# Patient Record
Sex: Female | Born: 1975 | Hispanic: Yes | Marital: Married | Smoking: Former smoker
Health system: Southern US, Community
[De-identification: ages and names within clinical notes are randomized; demographics above are authoritative.]

---

## 2021-02-08 ENCOUNTER — Encounter: Payer: Self-pay | Admitting: Nurse Practitioner

## 2021-02-08 ENCOUNTER — Ambulatory Visit (INDEPENDENT_AMBULATORY_CARE_PROVIDER_SITE_OTHER): Payer: BC Managed Care – PPO | Admitting: Nurse Practitioner

## 2021-02-08 ENCOUNTER — Other Ambulatory Visit: Payer: Self-pay

## 2021-02-08 VITALS — BP 106/68 | HR 68 | Temp 97.9°F | Ht 64.0 in | Wt 252.2 lb

## 2021-02-08 DIAGNOSIS — Z6841 Body Mass Index (BMI) 40.0 and over, adult: Secondary | ICD-10-CM | POA: Diagnosis not present

## 2021-02-08 DIAGNOSIS — Z7689 Persons encountering health services in other specified circumstances: Secondary | ICD-10-CM

## 2021-02-08 DIAGNOSIS — Z1231 Encounter for screening mammogram for malignant neoplasm of breast: Secondary | ICD-10-CM

## 2021-02-08 NOTE — Progress Notes (Signed)
New Patient Office Visit  Subjective:  Patient ID: Betty Sellers, female    DOB: [birth year]  Age: 45 y.o. MRN: [id number]  CC:  Chief Complaint  Patient presents with   New Patient (Initial Visit)    HPI Betty Sellers presents to establish new primary care provider. The patient reports no pertinent, personal medical history. She odes have family history of diabetes and circulatory problems. She states that it has been some time she she had routine physical exam, labs, and mammogram. She has not had a mammogram. She has no current problems or concerns.  She denies chest pain, chest pressure, or shortness of breath. She denies headaches or visual disturbances. She denies abdominal pain, nausea, vomiting, or changes in bowel or bladder habits.    History reviewed. No pertinent past medical history.  Past Surgical History:  Procedure Laterality Date   CESAREAN SECTION  2010   CESAREAN SECTION  2012    Family History  Problem Relation Age of Onset   Diabetes Other     Social History   Socioeconomic History   Marital status: Married    Spouse name: Not on file   Number of children: Not on file   Years of education: Not on file   Highest education level: Not on file  Occupational History   Not on file  Tobacco Use   Smoking status: Former    Types: Cigarettes    Quit date: 2009    Years since quitting: 13.8   Smokeless tobacco: Never  Substance and Sexual Activity   Alcohol use: Yes   Drug use: Never   Sexual activity: Yes    Partners: Male  Other Topics Concern   Not on file  Social History Narrative   Not on file   Social Determinants of Health   Financial Resource Strain: Not on file  Food Insecurity: Not on file  Transportation Needs: Not on file  Physical Activity: Not on file  Stress: Not on file  Social Connections: Not on file  Intimate Partner Violence: Not on file    ROS Review of Systems  Constitutional:  Negative for activity change,  appetite change, chills, fatigue and fever.  HENT:  Negative for congestion, postnasal drip, rhinorrhea, sinus pressure, sinus pain, sneezing and sore throat.   Eyes: Negative.   Respiratory:  Negative for cough, chest tightness, shortness of breath and wheezing.   Cardiovascular:  Negative for chest pain and palpitations.  Gastrointestinal:  Negative for abdominal pain, constipation, diarrhea, nausea and vomiting.  Endocrine: Negative for cold intolerance, heat intolerance, polydipsia and polyuria.  Genitourinary:  Negative for dyspareunia, dysuria, flank pain, frequency and urgency.  Musculoskeletal:  Negative for arthralgias, back pain and myalgias.  Skin:  Negative for rash.  Allergic/Immunologic: Negative for environmental allergies.  Neurological:  Negative for dizziness, weakness and headaches.  Hematological:  Negative for adenopathy.  Psychiatric/Behavioral:  The patient is not nervous/anxious.    Objective:   Today's Vitals   02/08/21 1130  BP: 106/68  Pulse: 68  Temp: 97.9 F (36.6 C)  SpO2: 100%  Weight: 252 lb 3.2 oz (114.4 kg)  Height: 5\' 4"  (1.626 m)   Body mass index is 43.29 kg/m.   Physical Exam Vitals and nursing note reviewed.  Constitutional:      Appearance: Normal appearance. She is well-developed.  HENT:     Head: Normocephalic.  Eyes:     Pupils: Pupils are equal, round, and reactive to light.  Cardiovascular:  Rate and Rhythm: Normal rate and regular rhythm.     Pulses: Normal pulses.     Heart sounds: Normal heart sounds.  Pulmonary:     Effort: Pulmonary effort is normal.     Breath sounds: Normal breath sounds.  Abdominal:     Palpations: Abdomen is soft.  Musculoskeletal:        General: Normal range of motion.     Cervical back: Normal range of motion and neck supple.  Lymphadenopathy:     Cervical: No cervical adenopathy.  Skin:    General: Skin is warm and dry.     Capillary Refill: Capillary refill takes less than 2 seconds.   Neurological:     General: No focal deficit present.     Mental Status: She is alert and oriented to person, place, and time.  Psychiatric:        Mood and Affect: Mood normal.        Behavior: Behavior normal.        Thought Content: Thought content normal.        Judgment: Judgment normal.    Assessment & Plan:  1. Encounter to establish care Appointment today to establish new primary care provider    2. Body mass index (BMI) of 40.1-44.9 in adult [facility]) Discussed lowering calorie intake to 1500 calories per day and incorporating exercise into daily routine to help lose weight. Will monitor.   3. Encounter for screening mammogram for malignant neoplasm of breast Screening mammogram ordered today - MM DIGITAL SCREENING BILATERAL; Future   Problem List Items Addressed This Visit       Other   Body mass index (BMI) of 40.1-44.9 in adult [facility])   Other Visit Diagnoses     Encounter to establish care    -  Primary   Encounter for screening mammogram for malignant neoplasm of breast       Relevant Orders   MM DIGITAL SCREENING BILATERAL       No outpatient encounter medications on file as of 02/08/2021.   No facility-administered encounter medications on file as of 02/08/2021.    Follow-up: Return in about 4 weeks (around 03/08/2021) for health maintenance exam, with pap, FBW a week prior to visit - add Free T4, Vitamin d, and HgbA1c .   Carlean Jews, NP

## 2021-02-08 NOTE — Patient Instructions (Signed)

## 2021-02-24 ENCOUNTER — Other Ambulatory Visit: Payer: BC Managed Care – PPO

## 2021-03-07 ENCOUNTER — Encounter: Payer: BC Managed Care – PPO | Admitting: Nurse Practitioner

## 2021-03-22 ENCOUNTER — Other Ambulatory Visit: Payer: Self-pay | Admitting: Nurse Practitioner

## 2021-03-22 DIAGNOSIS — Z0000 Encounter for general adult medical examination without abnormal findings: Secondary | ICD-10-CM

## 2021-03-23 ENCOUNTER — Other Ambulatory Visit: Payer: Self-pay

## 2021-03-23 ENCOUNTER — Other Ambulatory Visit: Payer: BC Managed Care – PPO

## 2021-03-23 DIAGNOSIS — Z0000 Encounter for general adult medical examination without abnormal findings: Secondary | ICD-10-CM

## 2021-03-24 LAB — COMPREHENSIVE METABOLIC PANEL
ALT: 18 IU/L (ref 0–32)
AST: 14 IU/L (ref 0–40)
Albumin/Globulin Ratio: 1.5 (ref 1.2–2.2)
Albumin: 4 g/dL (ref 3.8–4.8)
Alkaline Phosphatase: 81 IU/L (ref 44–121)
BUN/Creatinine Ratio: 11 (ref 9–23)
BUN: 9 mg/dL (ref 6–24)
Bilirubin Total: 0.3 mg/dL (ref 0.0–1.2)
CO2: 21 mmol/L (ref 20–29)
Calcium: 8.7 mg/dL (ref 8.7–10.2)
Chloride: 103 mmol/L (ref 96–106)
Creatinine, Ser: 0.81 mg/dL (ref 0.57–1.00)
Globulin, Total: 2.7 g/dL (ref 1.5–4.5)
Glucose: 112 mg/dL — ABNORMAL HIGH (ref 70–99)
Potassium: 5 mmol/L (ref 3.5–5.2)
Sodium: 138 mmol/L (ref 134–144)
Total Protein: 6.7 g/dL (ref 6.0–8.5)
eGFR: 92 mL/min/{1.73_m2} (ref >59)

## 2021-03-24 LAB — CBC
Hematocrit: 35.9 % (ref 34.0–46.6)
Hemoglobin: 11.6 g/dL (ref 11.1–15.9)
MCH: 26.2 pg — ABNORMAL LOW (ref 26.6–33.0)
MCHC: 32.3 g/dL (ref 31.5–35.7)
MCV: 81 fL (ref 79–97)
Platelets: 410 10*3/uL (ref 150–450)
RBC: 4.42 x10E6/uL (ref 3.77–5.28)
RDW: 14 % (ref 11.7–15.4)
WBC: 11 10*3/uL — ABNORMAL HIGH (ref 3.4–10.8)

## 2021-03-24 LAB — HEMOGLOBIN A1C
Est. average glucose Bld gHb Est-mCnc: 126 mg/dL
Hgb A1c MFr Bld: 6 % — ABNORMAL HIGH (ref 4.8–5.6)

## 2021-03-24 LAB — TSH: TSH: 2.51 u[IU]/mL (ref 0.450–4.500)

## 2021-03-24 LAB — LIPID PANEL
Chol/HDL Ratio: 4.1 ratio (ref 0.0–4.4)
Cholesterol, Total: 152 mg/dL (ref 100–199)
HDL: 37 mg/dL — ABNORMAL LOW (ref >39)
LDL Chol Calc (NIH): 96 mg/dL (ref 0–99)
Triglycerides: 100 mg/dL (ref 0–149)
VLDL Cholesterol Cal: 19 mg/dL (ref 5–40)

## 2021-03-28 NOTE — Progress Notes (Signed)
Labs generally good. Review with patient at visit 03/30/2021

## 2021-03-29 ENCOUNTER — Ambulatory Visit
Admission: RE | Admit: 2021-03-29 | Discharge: 2021-03-29 | Disposition: A | Discharge status: Home / Self Care | Payer: BC Managed Care – PPO | Source: Ambulatory Visit | Attending: Nurse Practitioner | Admitting: Nurse Practitioner

## 2021-03-29 DIAGNOSIS — Z1231 Encounter for screening mammogram for malignant neoplasm of breast: Secondary | ICD-10-CM

## 2021-03-30 NOTE — Progress Notes (Signed)
Negative mammogram

## 2021-03-31 ENCOUNTER — Encounter: Payer: Self-pay | Admitting: Nurse Practitioner

## 2021-03-31 ENCOUNTER — Ambulatory Visit (INDEPENDENT_AMBULATORY_CARE_PROVIDER_SITE_OTHER): Payer: BC Managed Care – PPO | Admitting: Nurse Practitioner

## 2021-03-31 ENCOUNTER — Other Ambulatory Visit (HOSPITAL_COMMUNITY)
Admission: RE | Admit: 2021-03-31 | Discharge: 2021-03-31 | Disposition: A | Discharge status: Home / Self Care | Payer: BC Managed Care – PPO | Source: Ambulatory Visit | Attending: Nurse Practitioner | Admitting: Nurse Practitioner

## 2021-03-31 ENCOUNTER — Other Ambulatory Visit: Payer: Self-pay

## 2021-03-31 VITALS — BP 100/66 | HR 73 | Temp 98.4°F | Ht 64.0 in | Wt 250.9 lb

## 2021-03-31 DIAGNOSIS — Z6841 Body Mass Index (BMI) 40.0 and over, adult: Secondary | ICD-10-CM | POA: Diagnosis not present

## 2021-03-31 DIAGNOSIS — Z23 Encounter for immunization: Secondary | ICD-10-CM | POA: Diagnosis not present

## 2021-03-31 DIAGNOSIS — R7301 Impaired fasting glucose: Secondary | ICD-10-CM | POA: Diagnosis not present

## 2021-03-31 DIAGNOSIS — Z01419 Encounter for gynecological examination (general) (routine) without abnormal findings: Secondary | ICD-10-CM | POA: Insufficient documentation

## 2021-03-31 NOTE — Patient Instructions (Addendum)
Calorie Counting for Weight Loss °Calories are units of energy. Your body needs a certain number of calories from food to keep going throughout the day. When you eat or drink more calories than your body needs, your body stores the extra calories mostly as fat. When you eat or drink fewer calories than your body needs, your body burns fat to get the energy it needs. °Calorie counting means keeping track of how many calories you eat and drink each day. Calorie counting can be helpful if you need to lose weight. If you eat fewer calories than your body needs, you should lose weight. Ask your health care provider what a healthy weight is for you. °For calorie counting to work, you will need to eat the right number of calories each day to lose a healthy amount of weight per week. A dietitian can help you figure out how many calories you need in a day and will suggest ways to reach your calorie goal. °A healthy amount of weight to lose each week is usually 1-2 lb (0.5-0.9 kg). This usually means that your daily calorie intake should be reduced by 500-750 calories. °Eating 1,200-1,500 calories a day can help most women lose weight. °Eating 1,500-1,800 calories a day can help most men lose weight. °What do I need to know about calorie counting? °Work with your health care provider or dietitian to determine how many calories you should get each day. To meet your daily calorie goal, you will need to: °Find out how many calories are in each food that you would like to eat. Try to do this before you eat. °Decide how much of the food you plan to eat. °Keep a food log. Do this by writing down what you ate and how many calories it had. °To successfully lose weight, it is important to balance calorie counting with a healthy lifestyle that includes regular activity. °Where do I find calorie information? °The number of calories in a food can be found on a Nutrition Facts label. If a food does not have a Nutrition Facts label, try to  look up the calories online or ask your dietitian for help. °Remember that calories are listed per serving. If you choose to have more than one serving of a food, you will have to multiply the calories per serving by the number of servings you plan to eat. For example, the label on a package of bread might say that a serving size is 1 slice and that there are 90 calories in a serving. If you eat 1 slice, you will have eaten 90 calories. If you eat 2 slices, you will have eaten 180 calories. °How do I keep a food log? °After each time that you eat, record the following in your food log as soon as possible: °What you ate. Be sure to include toppings, sauces, and other extras on the food. °How much you ate. This can be measured in cups, ounces, or number of items. °How many calories were in each food and drink. °The total number of calories in the food you ate. °Keep your food log near you, such as in a pocket-sized notebook or on an app or website on your mobile phone. Some programs will calculate calories for you and show you how many calories you have left to meet your daily goal. °What are some portion-control tips? °Know how many calories are in a serving. This will help you know how many servings you can have of a certain food. °  Use a measuring cup to measure serving sizes. You could also try weighing out portions on a kitchen scale. With time, you will be able to estimate serving sizes for some foods. °Take time to put servings of different foods on your favorite plates or in your favorite bowls and cups so you know what a serving looks like. °Try not to eat straight from a food's packaging, such as from a bag or box. Eating straight from the package makes it hard to see how much you are eating and can lead to overeating. Put the amount you would like to eat in a cup or on a plate to make sure you are eating the right portion. °Use smaller plates, glasses, and bowls for smaller portions and to prevent  overeating. °Try not to multitask. For example, avoid watching TV or using your computer while eating. If it is time to eat, sit down at a table and enjoy your food. This will help you recognize when you are full. It will also help you be more mindful of what and how much you are eating. °What are tips for following this plan? °Reading food labels °Check the calorie count compared with the serving size. The serving size may be smaller than what you are used to eating. °Check the source of the calories. Try to choose foods that are high in protein, fiber, and vitamins, and low in saturated fat, trans fat, and sodium. °Shopping °Read nutrition labels while you shop. This will help you make healthy decisions about which foods to buy. °Pay attention to nutrition labels for low-fat or fat-free foods. These foods sometimes have the same number of calories or more calories than the full-fat versions. They also often have added sugar, starch, or salt to make up for flavor that was removed with the fat. °Make a grocery list of lower-calorie foods and stick to it. °Cooking °Try to cook your favorite foods in a healthier way. For example, try baking instead of frying. °Use low-fat dairy products. °Meal planning °Use more fruits and vegetables. One-half of your plate should be fruits and vegetables. °Include lean proteins, such as chicken, turkey, and fish. °Lifestyle °Each week, aim to do one of the following: °150 minutes of moderate exercise, such as walking. °75 minutes of vigorous exercise, such as running. °General information °Know how many calories are in the foods you eat most often. This will help you calculate calorie counts faster. °Find a way of tracking calories that works for you. Get creative. Try different apps or programs if writing down calories does not work for you. °What foods should I eat? ° °Eat nutritious foods. It is better to have a nutritious, high-calorie food, such as an avocado, than a food with  few nutrients, such as a bag of potato chips. °Use your calories on foods and drinks that will fill you up and will not leave you hungry soon after eating. °Examples of foods that fill you up are nuts and nut butters, vegetables, lean proteins, and high-fiber foods such as whole grains. High-fiber foods are foods with more than 5 g of fiber per serving. °Pay attention to calories in drinks. Low-calorie drinks include water and unsweetened drinks. °The items listed above may not be a complete list of foods and beverages you can eat. Contact a dietitian for more information. °What foods should I limit? °Limit foods or drinks that are not good sources of vitamins, minerals, or protein or that are high in unhealthy fats. These include: °  Candy. °Other sweets. °Sodas, specialty coffee drinks, alcohol, and juice. °The items listed above may not be a complete list of foods and beverages you should avoid. Contact a dietitian for more information. °How do I count calories when eating out? °Pay attention to portions. Often, portions are much larger when eating out. Try these tips to keep portions smaller: °Consider sharing a meal instead of getting your own. °If you get your own meal, eat only half of it. Before you start eating, ask for a container and put half of your meal into it. °When available, consider ordering smaller portions from the menu instead of full portions. °Pay attention to your food and drink choices. Knowing the way food is cooked and what is included with the meal can help you eat fewer calories. °If calories are listed on the menu, choose the lower-calorie options. °Choose dishes that include vegetables, fruits, whole grains, low-fat dairy products, and lean proteins. °Choose items that are boiled, broiled, grilled, or steamed. Avoid items that are buttered, battered, fried, or served with cream sauce. Items labeled as crispy are usually fried, unless stated otherwise. °Choose water, low-fat milk,  unsweetened iced tea, or other drinks without added sugar. If you want an alcoholic beverage, choose a lower-calorie option, such as a glass of wine or light beer. °Ask for dressings, sauces, and syrups on the side. These are usually high in calories, so you should limit the amount you eat. °If you want a salad, choose a garden salad and ask for grilled meats. Avoid extra toppings such as bacon, cheese, or fried items. Ask for the dressing on the side, or ask for olive oil and vinegar or lemon to use as dressing. °Estimate how many servings of a food you are given. Knowing serving sizes will help you be aware of how much food you are eating at restaurants. °Where to find more information °Centers for Disease Control and Prevention: www.cdc.gov °[place] Department of Agriculture: myplate.gov °Summary °Calorie counting means keeping track of how many calories you eat and drink each day. If you eat fewer calories than your body needs, you should lose weight. °A healthy amount of weight to lose per week is usually 1-2 lb (0.5-0.9 kg). This usually means reducing your daily calorie intake by 500-750 calories. °The number of calories in a food can be found on a Nutrition Facts label. If a food does not have a Nutrition Facts label, try to look up the calories online or ask your dietitian for help. °Use smaller plates, glasses, and bowls for smaller portions and to prevent overeating. °Use your calories on foods and drinks that will fill you up and not leave you hungry shortly after a meal. °This information is not intended to replace advice given to you by your health care provider. Make sure you discuss any questions you have with your health care provider. °Document Revised: 05/14/2019 Document Reviewed: 05/14/2019 °Elsevier Patient Education © 2022 [employer] ° °Fat and Cholesterol Restricted Eating Plan °Getting too much fat and cholesterol in your diet may cause health problems. Choosing the right foods helps keep  your fat and cholesterol at normal levels. This can keep you from getting certain diseases. °Your doctor may recommend an eating plan that includes: °Total fat: ______% or less of total calories a day. This is ______g of fat a day. °Saturated fat: ______% or less of total calories a day. This is ______g of saturated fat a day. °Cholesterol: less than _________mg a day. °Fiber: ______g a   day. °What are tips for following this plan? °General tips °Work with your doctor to lose weight if you need to. °Avoid: °Foods with added sugar. °Fried foods. °Foods with trans fat or partially hydrogenated oils. This includes some margarines and baked goods. °If you drink alcohol: °Limit how much you have to: °0-1 drink a day for women who are not pregnant. °0-2 drinks a day for men. °Know how much alcohol is in a drink. In the [place] one drink equals one 12 oz bottle of beer (355 mL), one 5 oz glass of wine (148 mL), or one 1½ oz glass of hard liquor (44 mL). °Reading food labels °Check food labels for: °Trans fats. °Partially hydrogenated oils. °Saturated fat (g) in each serving. °Cholesterol (mg) in each serving. °Fiber (g) in each serving. °Choose foods with healthy fats, such as: °Monounsaturated fats and polyunsaturated fats. These include olive and canola oil, flaxseeds, walnuts, almonds, and seeds. °Omega-3 fats. These are found in certain fish, flaxseed oil, and ground flaxseeds. °Choose grain products that have whole grains. Look for the word "whole" as the first word in the ingredient list. °Cooking °Cook foods using low-fat methods. These include baking, boiling, grilling, and broiling. °Eat more home-cooked foods. Eat at restaurants and buffets less often. Eat less fast food. °Avoid cooking using saturated fats, such as butter, cream, palm oil, palm kernel oil, and coconut oil. °Meal planning ° °At meals, divide your plate into four equal parts: °Fill one-half of your plate with vegetables, green salads, and  fruit. °Fill one-fourth of your plate with whole grains. °Fill one-fourth of your plate with low-fat (lean) protein foods. °Eat fish that is high in omega-3 fats at least two times a week. This includes mackerel, tuna, sardines, and salmon. °Eat foods that are high in fiber, such as whole grains, beans, apples, pears, berries, broccoli, carrots, peas, and barley. °What foods should I eat? °Fruits °All fresh, canned (in natural juice), or frozen fruits. °Vegetables °Fresh or frozen vegetables (raw, steamed, roasted, or grilled). Green salads. °Grains °Whole grains, such as whole wheat or whole grain breads, crackers, cereals, and pasta. Unsweetened oatmeal, bulgur, barley, quinoa, or brown rice. Corn or whole wheat flour tortillas. °Meats and other protein foods °Ground beef (85% or leaner), grass-fed beef, or beef trimmed of fat. Skinless chicken or turkey. Ground chicken or turkey. Pork trimmed of fat. All fish and seafood. Egg whites. Dried beans, peas, or lentils. Unsalted nuts or seeds. Unsalted canned beans. Nut butters without added sugar or oil. °Dairy °Low-fat or nonfat dairy products, such as skim or 1% milk, 2% or reduced-fat cheeses, low-fat and fat-free ricotta or cottage cheese, or plain low-fat and nonfat yogurt. °Fats and oils °Tub margarine without trans fats. Light or reduced-fat mayonnaise and salad dressings. Avocado. Olive, canola, sesame, or safflower oils. °The items listed above may not be a complete list of foods and beverages you can eat. Contact a dietitian for more information. °What foods should I avoid? °Fruits °Canned fruit in heavy syrup. Fruit in cream or butter sauce. Fried fruit. °Vegetables °Vegetables cooked in cheese, cream, or butter sauce. Fried vegetables. °Grains °White bread. White pasta. White rice. Cornbread. Bagels, pastries, and croissants. Crackers and snack foods that contain trans fat and hydrogenated oils. °Meats and other protein foods °Fatty cuts of meat. Ribs,  chicken wings, bacon, sausage, bologna, salami, chitterlings, fatback, hot dogs, bratwurst, and packaged lunch meats. Liver and organ meats. Whole eggs and egg yolks. Chicken and turkey with skin. Fried meat. °  Dairy °Whole or 2% milk, cream, half-and-half, and cream cheese. Whole milk cheeses. Whole-fat or sweetened yogurt. Full-fat cheeses. Nondairy creamers and whipped toppings. Processed cheese, cheese spreads, and cheese curds. °Fats and oils °Butter, stick margarine, lard, shortening, ghee, or bacon fat. Coconut, palm kernel, and palm oils. °Beverages °Alcohol. Sugar-sweetened drinks such as sodas, lemonade, and fruit drinks. °Sweets and desserts °Corn syrup, sugars, honey, and molasses. Candy. Jam and jelly. Syrup. Sweetened cereals. Cookies, pies, cakes, donuts, muffins, and ice cream. °The items listed above may not be a complete list of foods and beverages you should avoid. Contact a dietitian for more information. °Summary °Choosing the right foods helps keep your fat and cholesterol at normal levels. This can keep you from getting certain diseases. °At meals, fill one-half of your plate with vegetables, green salads, and fruits. °Eat high fiber foods, like whole grains, beans, apples, pears, berries, carrots, peas, and barley. °Limit added sugar, saturated fats, alcohol, and fried foods. °This information is not intended to replace advice given to you by your health care provider. Make sure you discuss any questions you have with your health care provider. °Document Revised: 08/12/2020 Document Reviewed: 08/12/2020 °Elsevier Patient Education © 2022 [employer] ° °Fat and Cholesterol Restricted Eating Plan °Getting too much fat and cholesterol in your diet may cause health problems. Choosing the right foods helps keep your fat and cholesterol at normal levels. This can keep you from getting certain diseases. °Your doctor may recommend an eating plan that includes: °Total fat: ______% or less of total  calories a day. This is ______g of fat a day. °Saturated fat: ______% or less of total calories a day. This is ______g of saturated fat a day. °Cholesterol: less than _________mg a day. °Fiber: ______g a day. °What are tips for following this plan? °General tips °Work with your doctor to lose weight if you need to. °Avoid: °Foods with added sugar. °Fried foods. °Foods with trans fat or partially hydrogenated oils. This includes some margarines and baked goods. °If you drink alcohol: °Limit how much you have to: °0-1 drink a day for women who are not pregnant. °0-2 drinks a day for men. °Know how much alcohol is in a drink. In the [place] one drink equals one 12 oz bottle of beer (355 mL), one 5 oz glass of wine (148 mL), or one 1½ oz glass of hard liquor (44 mL). °Reading food labels °Check food labels for: °Trans fats. °Partially hydrogenated oils. °Saturated fat (g) in each serving. °Cholesterol (mg) in each serving. °Fiber (g) in each serving. °Choose foods with healthy fats, such as: °Monounsaturated fats and polyunsaturated fats. These include olive and canola oil, flaxseeds, walnuts, almonds, and seeds. °Omega-3 fats. These are found in certain fish, flaxseed oil, and ground flaxseeds. °Choose grain products that have whole grains. Look for the word "whole" as the first word in the ingredient list. °Cooking °Cook foods using low-fat methods. These include baking, boiling, grilling, and broiling. °Eat more home-cooked foods. Eat at restaurants and buffets less often. Eat less fast food. °Avoid cooking using saturated fats, such as butter, cream, palm oil, palm kernel oil, and coconut oil. °Meal planning ° °At meals, divide your plate into four equal parts: °Fill one-half of your plate with vegetables, green salads, and fruit. °Fill one-fourth of your plate with whole grains. °Fill one-fourth of your plate with low-fat (lean) protein foods. °Eat fish that is high in omega-3 fats at least two times a week. This  includes mackerel,   tuna, sardines, and salmon. Eat foods that are high in fiber, such as whole grains, beans, apples, pears, berries, broccoli, carrots, peas, and barley. What foods should I eat? Fruits All fresh, canned (in natural juice), or frozen fruits. Vegetables Fresh or frozen vegetables (raw, steamed, roasted, or grilled). Green salads. Grains Whole grains, such as whole wheat or whole grain breads, crackers, cereals, and pasta. Unsweetened oatmeal, bulgur, barley, quinoa, or brown rice. Corn or whole wheat flour tortillas. Meats and other protein foods Ground beef (85% or leaner), grass-fed beef, or beef trimmed of fat. Skinless chicken or [place]. Ground chicken or [place]. Pork trimmed of fat. All fish and seafood. Egg whites. Dried beans, peas, or lentils. Unsalted nuts or seeds. Unsalted canned beans. Nut butters without added sugar or oil. Dairy Low-fat or nonfat dairy products, such as skim or 1% milk, 2% or reduced-fat cheeses, low-fat and fat-free ricotta or cottage cheese, or plain low-fat and nonfat yogurt. Fats and oils Tub margarine without trans fats. Light or reduced-fat mayonnaise and salad dressings. Avocado. Olive, canola, sesame, or safflower oils. The items listed above may not be a complete list of foods and beverages you can eat. Contact a dietitian for more information. What foods should I avoid? Fruits Canned fruit in heavy syrup. Fruit in cream or butter sauce. Fried fruit. Vegetables Vegetables cooked in cheese, cream, or butter sauce. Fried vegetables. Grains White bread. White pasta. White rice. Cornbread. Bagels, pastries, and croissants. Crackers and snack foods that contain trans fat and hydrogenated oils. Meats and other protein foods Fatty cuts of meat. Ribs, chicken wings, bacon, sausage, bologna, salami, chitterlings, fatback, hot dogs, bratwurst, and packaged lunch meats. Liver and organ meats. Whole eggs and egg yolks. Chicken and [place] with skin.  Fried meat. Dairy Whole or 2% milk, cream, half-and-half, and cream cheese. Whole milk cheeses. Whole-fat or sweetened yogurt. Full-fat cheeses. Nondairy creamers and whipped toppings. Processed cheese, cheese spreads, and cheese curds. Fats and oils Butter, stick margarine, lard, shortening, ghee, or bacon fat. Coconut, palm kernel, and palm oils. Beverages Alcohol. Sugar-sweetened drinks such as sodas, lemonade, and fruit drinks. Sweets and desserts Corn syrup, sugars, honey, and molasses. Candy. Jam and jelly. Syrup. Sweetened cereals. Cookies, pies, cakes, donuts, muffins, and ice cream. The items listed above may not be a complete list of foods and beverages you should avoid. Contact a dietitian for more information. Summary Choosing the right foods helps keep your fat and cholesterol at normal levels. This can keep you from getting certain diseases. At meals, fill one-half of your plate with vegetables, green salads, and fruits. Eat high fiber foods, like whole grains, beans, apples, pears, berries, carrots, peas, and barley. Limit added sugar, saturated fats, alcohol, and fried foods. This information is not intended to replace advice given to you by your health care provider. Make sure you discuss any questions you have with your health care provider. Document Revised: 08/12/2020 Document Reviewed: 08/12/2020 Elsevier Patient Education  2022 [employer].

## 2021-03-31 NOTE — Progress Notes (Signed)
Established Patient Office Visit  Subjective:  Patient ID: Betty Sellers, female    DOB: 12-10-1975  Age: 45 y.o. MRN: [id number]  CC:  Chief Complaint  Patient presents with   Annual Exam   Gynecologic Exam    HPI Betty Sellers presents for annual wellness visit.  She can routine, fasting labs done prior to this visit.  Her HDL was slightly low, but her lipid panel was otherwise within normal limits.  Lipid Panel     Component Value Date/Time   CHOL 152 03/23/2021 0920   TRIG 100 03/23/2021 0920   HDL 37 (L) 03/23/2021 0920   CHOLHDL 4.1 03/23/2021 0920   LDLCALC 96 03/23/2021 0920   LABVLDL 19 03/23/2021 0920   Her blood sugar was 110 her hemoglobin A1c was 6.0.  Her white blood cell count was mildly elevated.  She states she has been fighting off a cold when she had her labs done. She has no new concerns or complaints today.  She denies chest pain, chest pressure, or shortness of breath. She denies headaches or visual disturbances. She denies abdominal pain, nausea, vomiting, or changes in bowel or bladder habits.    History reviewed. No pertinent past medical history.  Past Surgical History:  Procedure Laterality Date   CESAREAN SECTION  2010   CESAREAN SECTION  2012    Family History  Problem Relation Age of Onset   Diabetes Other     Social History   Socioeconomic History   Marital status: Married    Spouse name: Not on file   Number of children: Not on file   Years of education: Not on file   Highest education level: Not on file  Occupational History   Not on file  Tobacco Use   Smoking status: Former    Types: Cigarettes    Quit date: 2009    Years since quitting: 13.9   Smokeless tobacco: Never  Substance and Sexual Activity   Alcohol use: Yes   Drug use: Never   Sexual activity: Yes    Partners: Male  Other Topics Concern   Not on file  Social History Narrative   Not on file   Social Determinants of Health   Financial Resource  Strain: Not on file  Food Insecurity: Not on file  Transportation Needs: Not on file  Physical Activity: Not on file  Stress: Not on file  Social Connections: Not on file  Intimate Partner Violence: Not on file    No outpatient medications prior to visit.   No facility-administered medications prior to visit.    No Known Allergies  ROS Review of Systems  Constitutional:  Negative for activity change, appetite change, chills, fatigue and fever.  HENT:  Negative for congestion, postnasal drip, rhinorrhea, sinus pressure, sinus pain, sneezing and sore throat.   Eyes: Negative.   Respiratory:  Negative for cough, chest tightness, shortness of breath and wheezing.   Cardiovascular:  Negative for chest pain and palpitations.  Gastrointestinal:  Negative for abdominal pain, constipation, diarrhea, nausea and vomiting.  Endocrine: Negative for cold intolerance, heat intolerance, polydipsia and polyuria.  Genitourinary:  Negative for dyspareunia, dysuria, flank pain, frequency and urgency.  Musculoskeletal:  Negative for arthralgias, back pain and myalgias.  Skin:  Negative for rash.  Allergic/Immunologic: Negative for environmental allergies.  Neurological:  Negative for dizziness, weakness and headaches.  Hematological:  Negative for adenopathy.  Psychiatric/Behavioral:  The patient is not nervous/anxious.      Objective:  Physical Exam Vitals and nursing note reviewed. Exam conducted with a chaperone present.  Constitutional:      Appearance: Normal appearance. She is well-developed. She is obese.  HENT:     Head: Normocephalic and atraumatic.     Right Ear: Tympanic membrane, ear canal and external ear normal.     Left Ear: Tympanic membrane, ear canal and external ear normal.     Nose: Nose normal.     Mouth/Throat:     Mouth: Mucous membranes are moist.     Pharynx: Oropharynx is clear.  Eyes:     Extraocular Movements: Extraocular movements intact.      Conjunctiva/sclera: Conjunctivae normal.     Pupils: Pupils are equal, round, and reactive to light.  Cardiovascular:     Rate and Rhythm: Normal rate and regular rhythm.     Pulses: Normal pulses.     Heart sounds: Normal heart sounds.  Pulmonary:     Effort: Pulmonary effort is normal.     Breath sounds: Normal breath sounds.  Chest:  Breasts:    Right: Normal. No swelling, bleeding, inverted nipple, mass, nipple discharge, skin change or tenderness.     Left: Normal. No swelling, bleeding, inverted nipple, mass, nipple discharge, skin change or tenderness.  Abdominal:     General: Bowel sounds are normal. There is no distension.     Palpations: Abdomen is soft. There is no mass.     Tenderness: There is no abdominal tenderness. There is no guarding or rebound.     Hernia: No hernia is present.  Genitourinary:    General: Normal vulva.     Exam position: Supine.     Labia:        Right: No rash, tenderness or lesion.        Left: No rash, tenderness or lesion.      Vagina: Normal. No signs of injury and foreign body. No vaginal discharge, erythema, tenderness or bleeding.     Cervix: No cervical motion tenderness, discharge, friability, lesion, erythema or cervical bleeding.     Uterus: Normal.      Rectum: Normal.     Comments: No tenderness, masses, or organomeglay present during bimanual exam .   Musculoskeletal:        General: Normal range of motion.     Cervical back: Normal range of motion and neck supple.  Lymphadenopathy:     Upper Body:     Right upper body: No axillary adenopathy.     Left upper body: No axillary adenopathy.     Lower Body: No right inguinal adenopathy. No left inguinal adenopathy.  Skin:    General: Skin is warm and dry.     Capillary Refill: Capillary refill takes less than 2 seconds.  Neurological:     General: No focal deficit present.     Mental Status: She is alert and oriented to person, place, and time.  Psychiatric:        Mood and  Affect: Mood normal.        Behavior: Behavior normal.        Thought Content: Thought content normal.        Judgment: Judgment normal.   Today's Vitals   03/31/21 0950  BP: 100/66  Pulse: 73  Temp: 98.4 F (36.9 C)  SpO2: 98%  Weight: 250 lb 14.4 oz (113.8 kg)  Height: _0  (1.626 m)   Body mass index is 43.07 kg/m.   Wt Readings from Last 3  Encounters:  03/31/21 250 lb 14.4 oz (113.8 kg)  02/08/21 252 lb 3.2 oz (114.4 kg)     Health Maintenance Due  Topic Date Due   COVID-19 Vaccine (3 - Booster for Moderna series) 09/19/2019    There are no preventive care reminders to display for this patient.  Lab Results  Component Value Date   TSH 2.510 03/23/2021   Lab Results  Component Value Date   WBC 11.0 (H) 03/23/2021   HGB 11.6 03/23/2021   HCT 35.9 03/23/2021   MCV 81 03/23/2021   PLT 410 03/23/2021   Lab Results  Component Value Date   NA 138 03/23/2021   K 5.0 03/23/2021   CO2 21 03/23/2021   GLUCOSE 112 (H) 03/23/2021   BUN 9 03/23/2021   CREATININE 0.81 03/23/2021   BILITOT 0.3 03/23/2021   ALKPHOS 81 03/23/2021   AST 14 03/23/2021   ALT 18 03/23/2021   PROT 6.7 03/23/2021   ALBUMIN 4.0 03/23/2021   CALCIUM 8.7 03/23/2021   EGFR 92 03/23/2021   Lab Results  Component Value Date   CHOL 152 03/23/2021   Lab Results  Component Value Date   HDL 37 (L) 03/23/2021   Lab Results  Component Value Date   LDLCALC 96 03/23/2021   Lab Results  Component Value Date   TRIG 100 03/23/2021   Lab Results  Component Value Date   CHOLHDL 4.1 03/23/2021   Lab Results  Component Value Date   HGBA1C 6.0 (H) 03/23/2021      Assessment & Plan:  1. Well woman exam Annual wellness visit today.  Pap smear obtained during today's visit. - Cytology - PAP( [place])  2. Elevated fasting glucose Reviewed labs.  Blood sugar 110 with hemoglobin A1c 6.0.  Recommended patient limit intake of carbohydrates and sugar and increase intake of water.  She  should incorporate exercise into her daily routine.  We will monitor every 3 to 4 months.  3. Body mass index (BMI) of 40.1-44.9 in adult [facility]) Discussed lowering calorie intake to 1500 calories per day and incorporating exercise into daily routine to help lose weight.   4. Need for Tdap vaccination Tdap vaccine administered during today's visit. - Tdap vaccine greater than or equal to 7yo IM    Problem List Items Addressed This Visit       Other   Body mass index (BMI) of 40.1-44.9 in adult ([place])   Elevated fasting glucose   Other Visit Diagnoses     Well woman exam    -  Primary   Relevant Orders   Cytology - PAP( [place]) (Completed)   Need for Tdap vaccination       Relevant Orders   Tdap vaccine greater than or equal to 7yo IM (Completed)       Follow-up: Return in about 4 weeks (around 04/28/2021) for discuss weight loss medication and options. Marland Kitchen    Ronnell Freshwater, NP  This note was dictated using Systems analyst. Rapid proofreading was performed to expedite the delivery of the information. Despite proofreading, phonetic errors will occur which are common with this voice recognition software. Please take this into consideration. If there are any concerns, please contact our office.

## 2021-04-04 LAB — CYTOLOGY - PAP
Comment: NEGATIVE
Diagnosis: NEGATIVE
High risk HPV: NEGATIVE

## 2021-04-05 ENCOUNTER — Encounter: Payer: Self-pay | Admitting: Nurse Practitioner

## 2021-04-05 NOTE — Progress Notes (Signed)
Normal pap and negative HPV. Repeat in three years. MyChart message sent to patient.

## 2021-04-10 DIAGNOSIS — R7301 Impaired fasting glucose: Secondary | ICD-10-CM | POA: Insufficient documentation

## 2021-04-26 ENCOUNTER — Encounter: Payer: Self-pay | Admitting: Nurse Practitioner

## 2021-04-26 ENCOUNTER — Ambulatory Visit (INDEPENDENT_AMBULATORY_CARE_PROVIDER_SITE_OTHER): Payer: BC Managed Care – PPO | Admitting: Nurse Practitioner

## 2021-04-26 ENCOUNTER — Other Ambulatory Visit: Payer: Self-pay

## 2021-04-26 VITALS — BP 111/74 | HR 64 | Temp 98.5°F | Ht 64.0 in | Wt 259.4 lb

## 2021-04-26 DIAGNOSIS — R634 Abnormal weight loss: Secondary | ICD-10-CM

## 2021-04-26 DIAGNOSIS — Z6841 Body Mass Index (BMI) 40.0 and over, adult: Secondary | ICD-10-CM

## 2021-04-26 MED ORDER — PHENTERMINE HCL 37.5 MG PO CAPS: 37.5000 mg | ORAL_CAPSULE | Freq: Every day | ORAL | 0 refills | Status: DC

## 2021-04-26 NOTE — Patient Instructions (Addendum)
Fat and Cholesterol Restricted Eating Plan Getting too much fat and cholesterol in your diet may cause health problems. Choosing the right foods helps keep your fat and cholesterol at normal levels. This can keep you from getting certain diseases. Your doctor may recommend an eating plan that includes: Total fat: ______% or less of total calories a day. This is ______g of fat a day. Saturated fat: ______% or less of total calories a day. This is ______g of saturated fat a day. Cholesterol: less than _________mg a day. Fiber: ______g a day. What are tips for following this plan? General tips Work with your doctor to lose weight if you need to. Avoid: Foods with added sugar. Fried foods. Foods with trans fat or partially hydrogenated oils. This includes some margarines and baked goods. If you drink alcohol: Limit how much you have to: 0-1 drink a day for women who are not pregnant. 0-2 drinks a day for men. Know how much alcohol is in a drink. In the [place] one drink equals one 12 oz bottle of beer (355 mL), one 5 oz glass of wine (148 mL), or one 1 oz glass of hard liquor (44 mL). Reading food labels Check food labels for: Trans fats. Partially hydrogenated oils. Saturated fat (g) in each serving. Cholesterol (mg) in each serving. Fiber (g) in each serving. Choose foods with healthy fats, such as: Monounsaturated fats and polyunsaturated fats. These include olive and canola oil, flaxseeds, walnuts, almonds, and seeds. Omega-3 fats. These are found in certain fish, flaxseed oil, and ground flaxseeds. Choose grain products that have whole grains. Look for the word "whole" as the first word in the ingredient list. Cooking Cook foods using low-fat methods. These include baking, boiling, grilling, and broiling. Eat more home-cooked foods. Eat at restaurants and buffets less often. Eat less fast food. Avoid cooking using saturated fats, such as butter, cream, palm oil, palm kernel oil, and  coconut oil. Meal planning  At meals, divide your plate into four equal parts: Fill one-half of your plate with vegetables, green salads, and fruit. Fill one-fourth of your plate with whole grains. Fill one-fourth of your plate with low-fat (lean) protein foods. Eat fish that is high in omega-3 fats at least two times a week. This includes mackerel, tuna, sardines, and salmon. Eat foods that are high in fiber, such as whole grains, beans, apples, pears, berries, broccoli, carrots, peas, and barley. What foods should I eat? Fruits All fresh, canned (in natural juice), or frozen fruits. Vegetables Fresh or frozen vegetables (raw, steamed, roasted, or grilled). Green salads. Grains Whole grains, such as whole wheat or whole grain breads, crackers, cereals, and pasta. Unsweetened oatmeal, bulgur, barley, quinoa, or brown rice. Corn or whole wheat flour tortillas. Meats and other protein foods Ground beef (85% or leaner), grass-fed beef, or beef trimmed of fat. Skinless chicken or turkey. Ground chicken or turkey. Pork trimmed of fat. All fish and seafood. Egg whites. Dried beans, peas, or lentils. Unsalted nuts or seeds. Unsalted canned beans. Nut butters without added sugar or oil. Dairy Low-fat or nonfat dairy products, such as skim or 1% milk, 2% or reduced-fat cheeses, low-fat and fat-free ricotta or cottage cheese, or plain low-fat and nonfat yogurt. Fats and oils Tub margarine without trans fats. Light or reduced-fat mayonnaise and salad dressings. Avocado. Olive, canola, sesame, or safflower oils. The items listed above may not be a complete list of foods and beverages you can eat. Contact a dietitian for more information. What foods   should I avoid? Fruits Canned fruit in heavy syrup. Fruit in cream or butter sauce. Fried fruit. Vegetables Vegetables cooked in cheese, cream, or butter sauce. Fried vegetables. Grains White bread. White pasta. White rice. Cornbread. Bagels, pastries,  and croissants. Crackers and snack foods that contain trans fat and hydrogenated oils. Meats and other protein foods Fatty cuts of meat. Ribs, chicken wings, bacon, sausage, bologna, salami, chitterlings, fatback, hot dogs, bratwurst, and packaged lunch meats. Liver and organ meats. Whole eggs and egg yolks. Chicken and turkey with skin. Fried meat. Dairy Whole or 2% milk, cream, half-and-half, and cream cheese. Whole milk cheeses. Whole-fat or sweetened yogurt. Full-fat cheeses. Nondairy creamers and whipped toppings. Processed cheese, cheese spreads, and cheese curds. Fats and oils Butter, stick margarine, lard, shortening, ghee, or bacon fat. Coconut, palm kernel, and palm oils. Beverages Alcohol. Sugar-sweetened drinks such as sodas, lemonade, and fruit drinks. Sweets and desserts Corn syrup, sugars, honey, and molasses. Candy. Jam and jelly. Syrup. Sweetened cereals. Cookies, pies, cakes, donuts, muffins, and ice cream. The items listed above may not be a complete list of foods and beverages you should avoid. Contact a dietitian for more information. Summary Choosing the right foods helps keep your fat and cholesterol at normal levels. This can keep you from getting certain diseases. At meals, fill one-half of your plate with vegetables, green salads, and fruits. Eat high fiber foods, like whole grains, beans, apples, pears, berries, carrots, peas, and barley. Limit added sugar, saturated fats, alcohol, and fried foods. This information is not intended to replace advice given to you by your health care provider. Make sure you discuss any questions you have with your health care provider. Document Revised: 08/12/2020 Document Reviewed: 08/12/2020 Elsevier Patient Education  2022 [employer]  Fat and Cholesterol Restricted Eating Plan Getting too much fat and cholesterol in your diet may cause health problems. Choosing the right foods helps keep your fat and cholesterol at normal levels.  This can keep you from getting certain diseases. Your doctor may recommend an eating plan that includes: Total fat: ______% or less of total calories a day. This is ______g of fat a day. Saturated fat: ______% or less of total calories a day. This is ______g of saturated fat a day. Cholesterol: less than _________mg a day. Fiber: ______g a day. What are tips for following this plan? General tips Work with your doctor to lose weight if you need to. Avoid: Foods with added sugar. Fried foods. Foods with trans fat or partially hydrogenated oils. This includes some margarines and baked goods. If you drink alcohol: Limit how much you have to: 0-1 drink a day for women who are not pregnant. 0-2 drinks a day for men. Know how much alcohol is in a drink. In the [place] one drink equals one 12 oz bottle of beer (355 mL), one 5 oz glass of wine (148 mL), or one 1 oz glass of hard liquor (44 mL). Reading food labels Check food labels for: Trans fats. Partially hydrogenated oils. Saturated fat (g) in each serving. Cholesterol (mg) in each serving. Fiber (g) in each serving. Choose foods with healthy fats, such as: Monounsaturated fats and polyunsaturated fats. These include olive and canola oil, flaxseeds, walnuts, almonds, and seeds. Omega-3 fats. These are found in certain fish, flaxseed oil, and ground flaxseeds. Choose grain products that have whole grains. Look for the word "whole" as the first word in the ingredient list. Cooking Cook foods using low-fat methods. These include baking, boiling, grilling,   and broiling. Eat more home-cooked foods. Eat at restaurants and buffets less often. Eat less fast food. Avoid cooking using saturated fats, such as butter, cream, palm oil, palm kernel oil, and coconut oil. Meal planning  At meals, divide your plate into four equal parts: Fill one-half of your plate with vegetables, green salads, and fruit. Fill one-fourth of your plate with whole  grains. Fill one-fourth of your plate with low-fat (lean) protein foods. Eat fish that is high in omega-3 fats at least two times a week. This includes mackerel, tuna, sardines, and salmon. Eat foods that are high in fiber, such as whole grains, beans, apples, pears, berries, broccoli, carrots, peas, and barley. What foods should I eat? Fruits All fresh, canned (in natural juice), or frozen fruits. Vegetables Fresh or frozen vegetables (raw, steamed, roasted, or grilled). Green salads. Grains Whole grains, such as whole wheat or whole grain breads, crackers, cereals, and pasta. Unsweetened oatmeal, bulgur, barley, quinoa, or brown rice. Corn or whole wheat flour tortillas. Meats and other protein foods Ground beef (85% or leaner), grass-fed beef, or beef trimmed of fat. Skinless chicken or turkey. Ground chicken or turkey. Pork trimmed of fat. All fish and seafood. Egg whites. Dried beans, peas, or lentils. Unsalted nuts or seeds. Unsalted canned beans. Nut butters without added sugar or oil. Dairy Low-fat or nonfat dairy products, such as skim or 1% milk, 2% or reduced-fat cheeses, low-fat and fat-free ricotta or cottage cheese, or plain low-fat and nonfat yogurt. Fats and oils Tub margarine without trans fats. Light or reduced-fat mayonnaise and salad dressings. Avocado. Olive, canola, sesame, or safflower oils. The items listed above may not be a complete list of foods and beverages you can eat. Contact a dietitian for more information. What foods should I avoid? Fruits Canned fruit in heavy syrup. Fruit in cream or butter sauce. Fried fruit. Vegetables Vegetables cooked in cheese, cream, or butter sauce. Fried vegetables. Grains White bread. White pasta. White rice. Cornbread. Bagels, pastries, and croissants. Crackers and snack foods that contain trans fat and hydrogenated oils. Meats and other protein foods Fatty cuts of meat. Ribs, chicken wings, bacon, sausage, bologna, salami,  chitterlings, fatback, hot dogs, bratwurst, and packaged lunch meats. Liver and organ meats. Whole eggs and egg yolks. Chicken and turkey with skin. Fried meat. Dairy Whole or 2% milk, cream, half-and-half, and cream cheese. Whole milk cheeses. Whole-fat or sweetened yogurt. Full-fat cheeses. Nondairy creamers and whipped toppings. Processed cheese, cheese spreads, and cheese curds. Fats and oils Butter, stick margarine, lard, shortening, ghee, or bacon fat. Coconut, palm kernel, and palm oils. Beverages Alcohol. Sugar-sweetened drinks such as sodas, lemonade, and fruit drinks. Sweets and desserts Corn syrup, sugars, honey, and molasses. Candy. Jam and jelly. Syrup. Sweetened cereals. Cookies, pies, cakes, donuts, muffins, and ice cream. The items listed above may not be a complete list of foods and beverages you should avoid. Contact a dietitian for more information. Summary Choosing the right foods helps keep your fat and cholesterol at normal levels. This can keep you from getting certain diseases. At meals, fill one-half of your plate with vegetables, green salads, and fruits. Eat high fiber foods, like whole grains, beans, apples, pears, berries, carrots, peas, and barley. Limit added sugar, saturated fats, alcohol, and fried foods. This information is not intended to replace advice given to you by your health care provider. Make sure you discuss any questions you have with your health care provider. Document Revised: 08/12/2020 Document Reviewed: 08/12/2020 Elsevier Patient Education    2022 [employer] Inc.  

## 2021-04-26 NOTE — Progress Notes (Signed)
Established Patient Office Visit  Subjective:  Patient ID: Betty Sellers, female    DOB: January 17, 1976  Age: 46 y.o. MRN: [id number]  CC:  Chief Complaint  Patient presents with   Follow-up    HPI Betty Sellers presents for routine follow-up visit.  Today, she would like to discuss options for weight management.  States she is consuming a 1200 to 1500-calorie diet.  Most of her calories consists of fresh fresh and lean protein.  She does eat some sweets and carbs.  She tries to exercise on a daily basis.  She has lost 1 pound since she was last seen.  Feels like she needs some assistance with getting started on weight loss journey.  Past Surgical History:  Procedure Laterality Date   CESAREAN SECTION  2010   CESAREAN SECTION  2012    Family History  Problem Relation Age of Onset   Diabetes Other     Social History   Socioeconomic History   Marital status: Married    Spouse name: Not on file   Number of children: Not on file   Years of education: Not on file   Highest education level: Not on file  Occupational History   Not on file  Tobacco Use   Smoking status: Former    Types: Cigarettes    Quit date: 2009    Years since quitting: 14.0   Smokeless tobacco: Never  Substance and Sexual Activity   Alcohol use: Yes   Drug use: Never   Sexual activity: Yes    Partners: Male  Other Topics Concern   Not on file  Social History Narrative   Not on file   Social Determinants of Health   Financial Resource Strain: Not on file  Food Insecurity: Not on file  Transportation Needs: Not on file  Physical Activity: Not on file  Stress: Not on file  Social Connections: Not on file  Intimate Partner Violence: Not on file    No outpatient medications prior to visit.   No facility-administered medications prior to visit.    No Known Allergies  ROS Review of Systems  Constitutional:  Negative for activity change, appetite change, chills, fatigue and fever.  HENT:   Negative for congestion, postnasal drip, rhinorrhea, sinus pressure, sinus pain, sneezing and sore throat.   Eyes: Negative.   Respiratory:  Negative for cough, chest tightness, shortness of breath and wheezing.   Cardiovascular:  Negative for chest pain and palpitations.  Gastrointestinal:  Negative for abdominal pain, constipation, diarrhea, nausea and vomiting.  Endocrine: Negative for cold intolerance, heat intolerance, polydipsia and polyuria.  Genitourinary:  Negative for dyspareunia, dysuria, flank pain, frequency and urgency.  Musculoskeletal:  Negative for arthralgias, back pain and myalgias.  Skin:  Negative for rash.  Allergic/Immunologic: Negative for environmental allergies.  Neurological:  Negative for dizziness, weakness and headaches.  Hematological:  Negative for adenopathy.  Psychiatric/Behavioral:  The patient is not nervous/anxious.      Objective:    Physical Exam Vitals and nursing note reviewed.  Constitutional:      Appearance: Normal appearance. She is well-developed. She is obese.  HENT:     Head: Normocephalic and atraumatic.     Nose: Nose normal.     Mouth/Throat:     Mouth: Mucous membranes are moist.     Pharynx: Oropharynx is clear.  Eyes:     Extraocular Movements: Extraocular movements intact.     Conjunctiva/sclera: Conjunctivae normal.     Pupils: Pupils are  equal, round, and reactive to light.  Cardiovascular:     Rate and Rhythm: Normal rate and regular rhythm.     Pulses: Normal pulses.     Heart sounds: Normal heart sounds.  Pulmonary:     Effort: Pulmonary effort is normal.     Breath sounds: Normal breath sounds.  Abdominal:     Palpations: Abdomen is soft.  Musculoskeletal:        General: Normal range of motion.     Cervical back: Normal range of motion and neck supple.  Lymphadenopathy:     Cervical: No cervical adenopathy.  Skin:    General: Skin is warm and dry.     Capillary Refill: Capillary refill takes less than 2  seconds.  Neurological:     General: No focal deficit present.     Mental Status: She is alert and oriented to person, place, and time.  Psychiatric:        Mood and Affect: Mood normal.        Behavior: Behavior normal.        Thought Content: Thought content normal.        Judgment: Judgment normal.   Today's Vitals   04/26/21 1548  BP: 111/74  Pulse: 64  Temp: 98.5 F (36.9 C)  SpO2: 100%  Weight: 259 lb 6.4 oz (117.7 kg)  Height: 5' 4" (1.626 m)   Body mass index is 44.53 kg/m.   Wt Readings from Last 3 Encounters:  04/26/21 259 lb 6.4 oz (117.7 kg)  03/31/21 250 lb 14.4 oz (113.8 kg)  02/08/21 252 lb 3.2 oz (114.4 kg)     Health Maintenance Due  Topic Date Due   COVID-19 Vaccine (3 - Booster for Moderna series) 09/19/2019    There are no preventive care reminders to display for this patient.  Lab Results  Component Value Date   TSH 2.510 03/23/2021   Lab Results  Component Value Date   WBC 11.0 (H) 03/23/2021   HGB 11.6 03/23/2021   HCT 35.9 03/23/2021   MCV 81 03/23/2021   PLT 410 03/23/2021   Lab Results  Component Value Date   NA 138 03/23/2021   K 5.0 03/23/2021   CO2 21 03/23/2021   GLUCOSE 112 (H) 03/23/2021   BUN 9 03/23/2021   CREATININE 0.81 03/23/2021   BILITOT 0.3 03/23/2021   ALKPHOS 81 03/23/2021   AST 14 03/23/2021   ALT 18 03/23/2021   PROT 6.7 03/23/2021   ALBUMIN 4.0 03/23/2021   CALCIUM 8.7 03/23/2021   EGFR 92 03/23/2021   Lab Results  Component Value Date   CHOL 152 03/23/2021   Lab Results  Component Value Date   HDL 37 (L) 03/23/2021   Lab Results  Component Value Date   LDLCALC 96 03/23/2021   Lab Results  Component Value Date   TRIG 100 03/23/2021   Lab Results  Component Value Date   CHOLHDL 4.1 03/23/2021   Lab Results  Component Value Date   HGBA1C 6.0 (H) 03/23/2021      Assessment & Plan:  1. Weight loss Would like to discuss medicine options to help with weight loss.  Thyroid panel is  normal.  Hemoglobin A1c is slightly elevated at 6.0.  Weight loss would help her to control blood sugars as well as improve overall risk factors for cardiovascular disease and development of diabetes in the future.  We will do trial of phentermine 37.5 mg tablets daily.  She should continue to limit  her calorie intake to 1215 100 cal/day.  She should continue to exercise on a routine basis.  We will follow-up in 1 month for surveillance.  2. Body mass index (BMI) of 40.1-44.9 in adult [facility]) Discussed lowering calorie intake to 1500 calories per day and incorporating exercise into daily routine to help lose weight.  Trial phentermine 37.5 mg capsules daily.  We will follow-up in 1 month for surveillance. - phentermine 37.5 MG capsule; Take 1 capsule (37.5 mg total) by mouth daily.  Dispense: 30 capsule; Refill: 0    Problem List Items Addressed This Visit       Other   Body mass index (BMI) of 40.1-44.9 in adult [facility])   Relevant Medications   phentermine 37.5 MG capsule   Weight loss - Primary    Meds ordered this encounter  Medications   phentermine 37.5 MG capsule    Sig: Take 1 capsule (37.5 mg total) by mouth daily.    Dispense:  30 capsule    Refill:  0    Order Specific Question:   Supervising Provider    Answer:   Beatrice Lecher D [[id number]]    Follow-up: Return in 4 weeks (on 05/24/2021).    Ronnell Freshwater, NP  This note was dictated using Systems analyst. Rapid proofreading was performed to expedite the delivery of the information. Despite proofreading, phonetic errors will occur which are common with this voice recognition software. Please take this into consideration. If there are any concerns, please contact our office.

## 2021-05-01 DIAGNOSIS — R634 Abnormal weight loss: Secondary | ICD-10-CM | POA: Insufficient documentation

## 2021-05-24 ENCOUNTER — Ambulatory Visit: Payer: BC Managed Care – PPO | Admitting: Nurse Practitioner

## 2021-05-29 ENCOUNTER — Ambulatory Visit (INDEPENDENT_AMBULATORY_CARE_PROVIDER_SITE_OTHER): Payer: BC Managed Care – PPO | Admitting: Nurse Practitioner

## 2021-05-29 ENCOUNTER — Other Ambulatory Visit: Payer: Self-pay

## 2021-05-29 ENCOUNTER — Encounter: Payer: Self-pay | Admitting: Nurse Practitioner

## 2021-05-29 VITALS — BP 109/71 | HR 77 | Temp 98.5°F | Ht 64.0 in | Wt 257.8 lb

## 2021-05-29 DIAGNOSIS — R634 Abnormal weight loss: Secondary | ICD-10-CM

## 2021-05-29 DIAGNOSIS — Z6841 Body Mass Index (BMI) 40.0 and over, adult: Secondary | ICD-10-CM

## 2021-05-29 MED ORDER — PHENTERMINE HCL 37.5 MG PO CAPS: 37.5000 mg | ORAL_CAPSULE | Freq: Every day | ORAL | 0 refills | Status: DC

## 2021-05-29 NOTE — Progress Notes (Signed)
Established patient visit   Patient: Betty Sellers   DOB: 1975/11/01   46 y.o. Female  MRN: [id number] Visit Date: 05/29/2021   Chief Complaint  Patient presents with   Follow-up   Subjective    HPI  The patient is here for follow up of weight management. She was started on phentermine 04/26/2021. She has had a two pound weight loss. Feels a positive change in the way clothing is fitting and has more energy. She has increased her water intake. She is exercising more often. She is limiting her carbohydrate intake. She is having protein shakes in the mornings and a light lunch and smaller portioned dinner. She has experienced some constipation and dry mouth. She is taking a stool softener and drinking a tea to help constipation. No other negative side effects reported.    Medications: Outpatient Medications Prior to Visit  Medication Sig   [DISCONTINUED] phentermine 37.5 MG capsule Take 1 capsule (37.5 mg total) by mouth daily.   No facility-administered medications prior to visit.    Review of Systems  Constitutional:  Negative for activity change, appetite change, chills, fatigue and fever.       Two pound weight loss since her last visit.   HENT:  Negative for congestion, postnasal drip, rhinorrhea, sinus pressure, sinus pain, sneezing and sore throat.   Eyes: Negative.   Respiratory:  Negative for cough, chest tightness, shortness of breath and wheezing.   Cardiovascular:  Negative for chest pain and palpitations.  Gastrointestinal:  Positive for constipation. Negative for abdominal pain, diarrhea, nausea and vomiting.  Endocrine: Negative for cold intolerance, heat intolerance, polydipsia and polyuria.  Genitourinary:  Negative for dyspareunia, dysuria, flank pain, frequency and urgency.  Musculoskeletal:  Negative for arthralgias, back pain and myalgias.  Skin:  Negative for rash.  Allergic/Immunologic: Negative for environmental allergies.  Neurological:  Negative for  dizziness, weakness and headaches.  Hematological:  Negative for adenopathy.  Psychiatric/Behavioral:  The patient is not nervous/anxious.      Objective     Today's Vitals   05/29/21 1546  BP: 109/71  Pulse: 77  Temp: 98.5 F (36.9 C)  SpO2: 100%  Weight: 257 lb 12.8 oz (116.9 kg)  Height: 5\' 4"  (1.626 m)   Body mass index is 44.25 kg/m.  BP Readings from Last 3 Encounters:  05/29/21 109/71  04/26/21 111/74  03/31/21 100/66    Wt Readings from Last 3 Encounters:  05/29/21 257 lb 12.8 oz (116.9 kg)  04/26/21 259 lb 6.4 oz (117.7 kg)  03/31/21 250 lb 14.4 oz (113.8 kg)    Physical Exam Vitals and nursing note reviewed.  Constitutional:      Appearance: Normal appearance. She is well-developed. She is obese.  HENT:     Head: Normocephalic and atraumatic.  Eyes:     Pupils: Pupils are equal, round, and reactive to light.  Cardiovascular:     Rate and Rhythm: Normal rate and regular rhythm.     Pulses: Normal pulses.     Heart sounds: Normal heart sounds.  Pulmonary:     Effort: Pulmonary effort is normal.     Breath sounds: Normal breath sounds.  Abdominal:     Palpations: Abdomen is soft.  Musculoskeletal:        General: Normal range of motion.     Cervical back: Normal range of motion and neck supple.  Lymphadenopathy:     Cervical: No cervical adenopathy.  Skin:    General: Skin is warm and dry.  Capillary Refill: Capillary refill takes less than 2 seconds.  Neurological:     General: No focal deficit present.     Mental Status: She is alert and oriented to person, place, and time.  Psychiatric:        Mood and Affect: Mood normal.        Behavior: Behavior normal.        Thought Content: Thought content normal.        Judgment: Judgment normal.      Assessment & Plan    1. Weight loss Will continue phentermine for additional 30 days. Reassess at next visit.   2. Body mass index (BMI) of 40.1-44.9 in adult ([place]) Two pound weight loss since  her last visit. Continue phentermine for additional 30 days. Discussed lowering calorie intake to 1500 calories per day and incorporating exercise into daily routine to help lose weight.  - phentermine 37.5 MG capsule; Take 1 capsule (37.5 mg total) by mouth daily.  Dispense: 30 capsule; Refill: 0    Problem List Items Addressed This Visit       Other   Body mass index (BMI) of 40.1-44.9 in adult [facility])   Relevant Medications   phentermine 37.5 MG capsule   Weight loss - Primary     Return in about 4 weeks (around 06/26/2021) for routine - weight management.         Ronnell Freshwater, NP  [facility] at [facility] [phone] (phone) [phone] (fax)  [place]

## 2021-05-29 NOTE — Patient Instructions (Signed)

## 2021-06-26 ENCOUNTER — Ambulatory Visit: Payer: BC Managed Care – PPO | Admitting: Nurse Practitioner

## 2022-02-27 ENCOUNTER — Other Ambulatory Visit: Payer: Self-pay | Admitting: Nurse Practitioner

## 2022-02-27 DIAGNOSIS — Z1231 Encounter for screening mammogram for malignant neoplasm of breast: Secondary | ICD-10-CM

## 2022-04-04 ENCOUNTER — Ambulatory Visit: Payer: BC Managed Care – PPO

## 2022-12-25 IMAGING — MG MM DIGITAL SCREENING BILAT W/ TOMO AND CAD
8 series · 8 of 24 positions shown · non-contrast
Comparison: None.

CLINICAL DATA: Screening.

EXAM:
DIGITAL SCREENING BILATERAL MAMMOGRAM WITH TOMOSYNTHESIS AND CAD
TECHNIQUE: Bilateral screening digital craniocaudal and mediolateral oblique
mammograms were obtained. Bilateral screening digital breast
tomosynthesis was performed. The images were evaluated with
computer-aided detection.

[R MLO synth-2D]
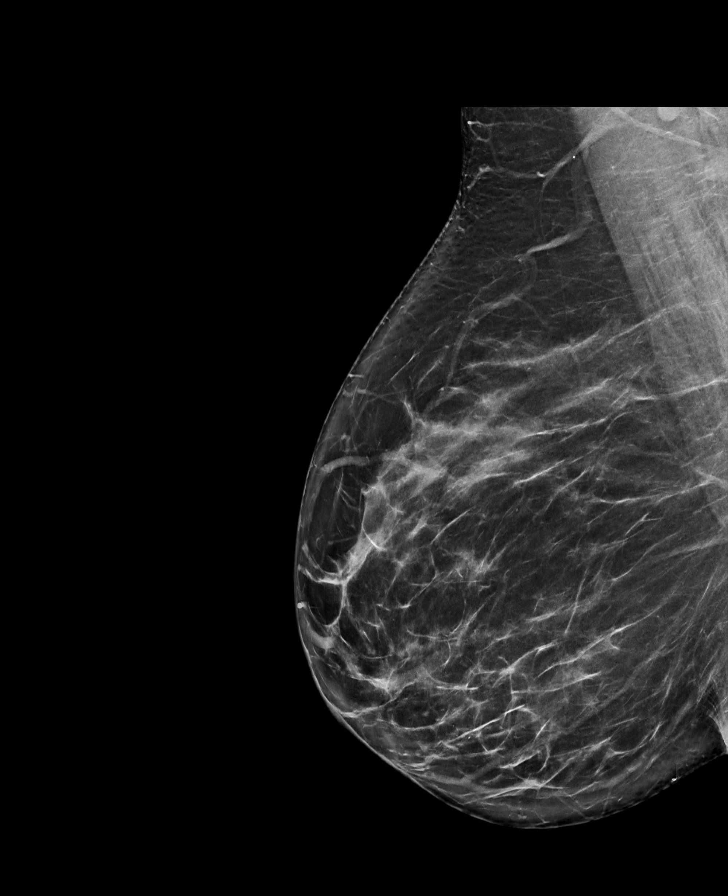

[L MLO synth-2D]
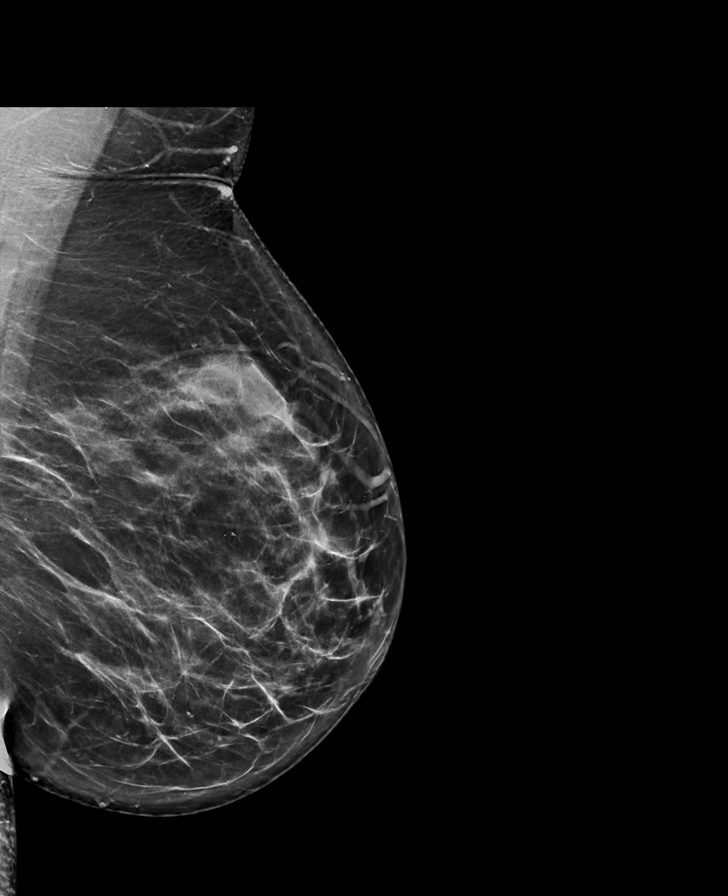

[L CC synth-2D]
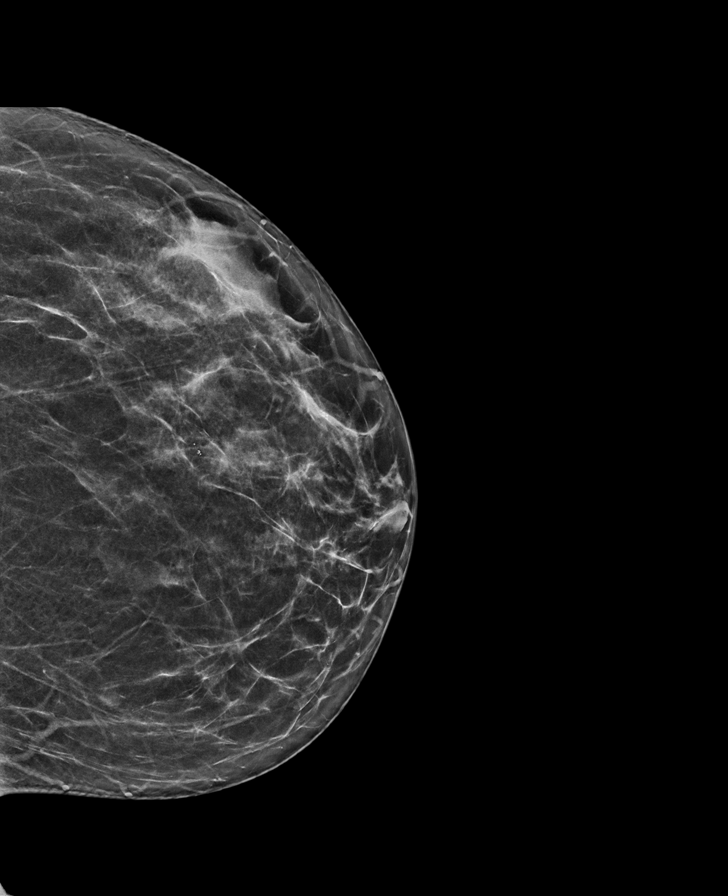

[R CC synth-2D]
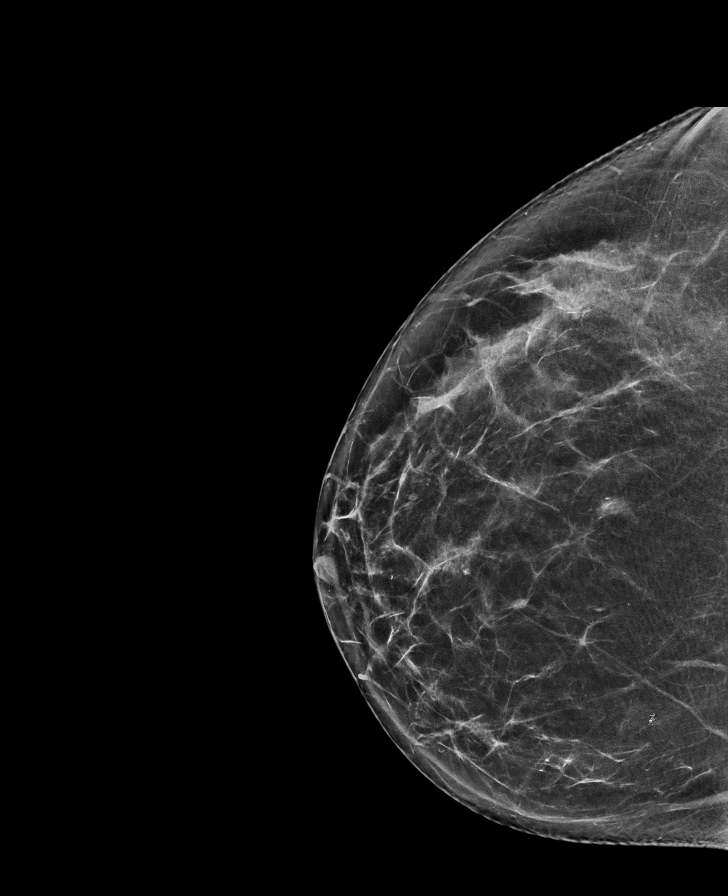

[L MLO tomo · tomo slice 43/85.0]
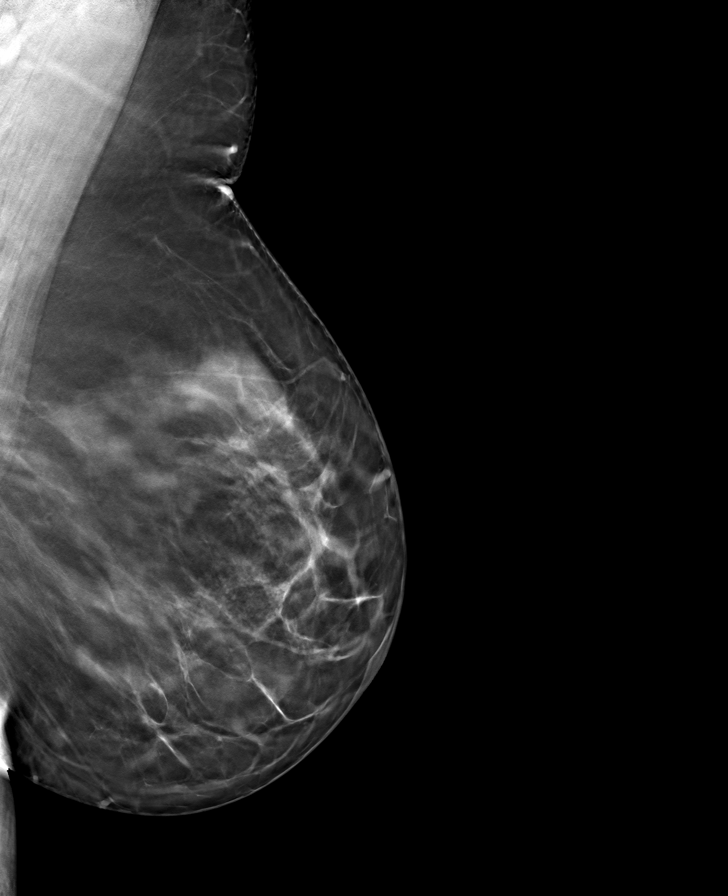

[R MLO tomo · tomo slice 45/88.0]
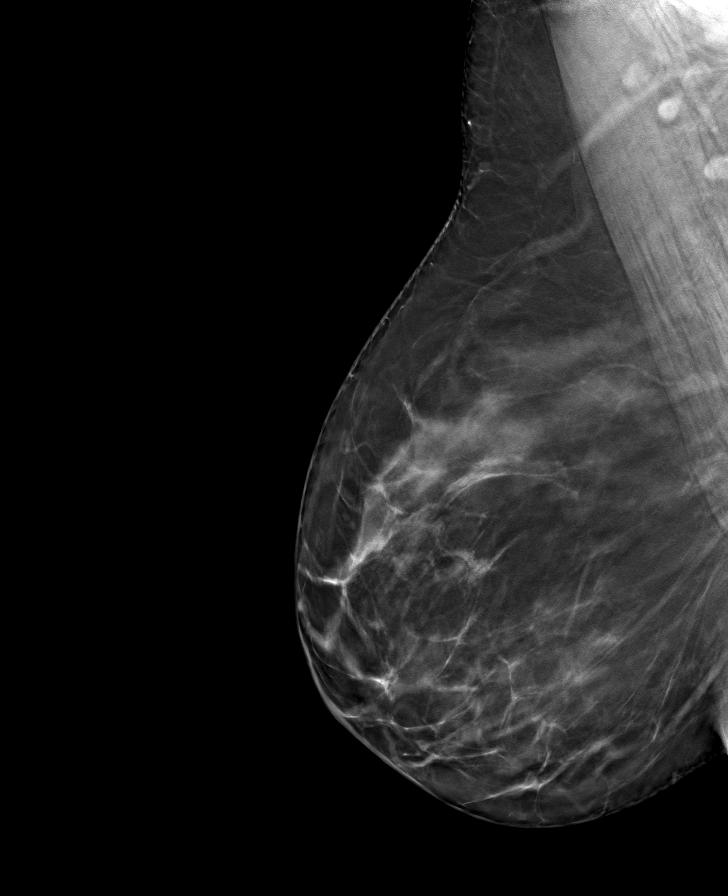

[L CC tomo · tomo slice 37/72.0]
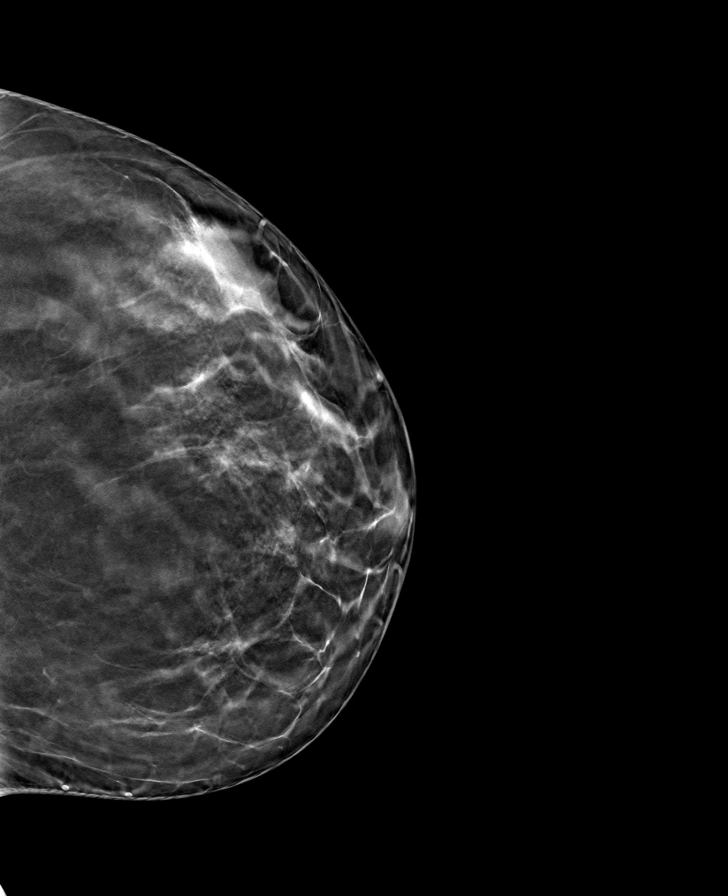

[R CC tomo · tomo slice 39/76.0]
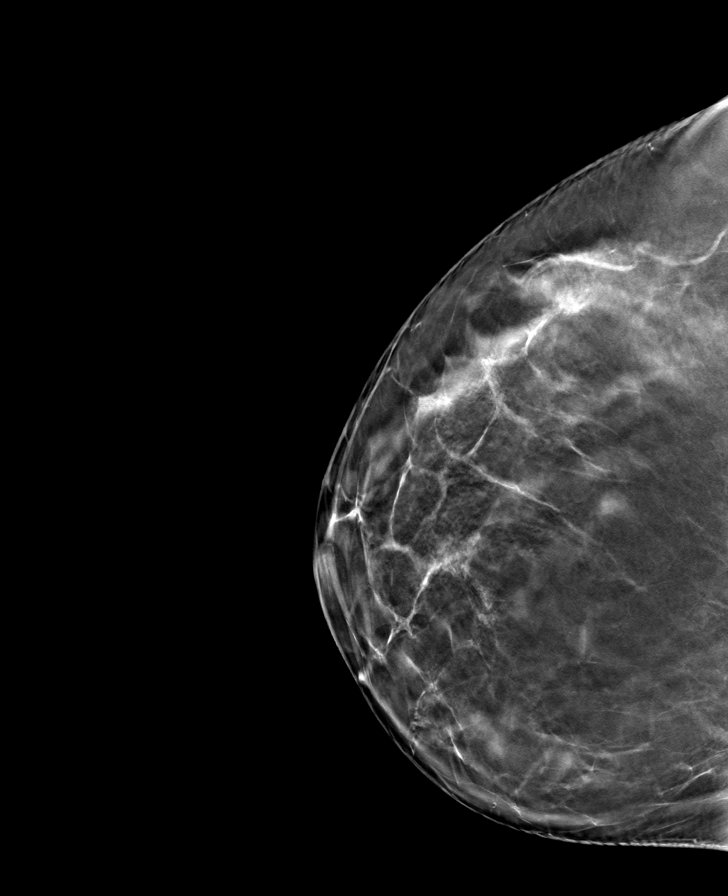

[8 of 24 positions shown; findings below may reference images not displayed]

ACR Breast Density Category b: There are scattered areas of
fibroglandular density.
FINDINGS: There are no findings suspicious for malignancy.
IMPRESSION: No mammographic evidence of malignancy. A result letter of this
screening mammogram will be mailed directly to the patient.

RECOMMENDATION:
Screening mammogram in one year. (Code:XG-X-X7B)

BI-RADS CATEGORY  1: Negative.

## 2023-01-14 ENCOUNTER — Encounter (HOSPITAL_COMMUNITY): Payer: Self-pay | Admitting: Emergency Medicine

## 2023-01-14 ENCOUNTER — Encounter (HOSPITAL_COMMUNITY): Payer: Self-pay

## 2023-01-14 ENCOUNTER — Emergency Department (HOSPITAL_COMMUNITY): Payer: BC Managed Care – PPO

## 2023-01-14 ENCOUNTER — Inpatient Hospital Stay (HOSPITAL_COMMUNITY)
Admission: EM | Admit: 2023-01-14 | Discharge: 2023-01-20 | DRG: 419 | Disposition: A | Discharge status: Home / Self Care | Payer: BC Managed Care – PPO | Attending: Surgery | Admitting: Surgery

## 2023-01-14 ENCOUNTER — Ambulatory Visit (INDEPENDENT_AMBULATORY_CARE_PROVIDER_SITE_OTHER)
Admission: RE | Admit: 2023-01-14 | Discharge: 2023-01-14 | Disposition: A | Discharge status: Home / Self Care | Payer: BC Managed Care – PPO | Source: Ambulatory Visit

## 2023-01-14 VITALS — BP 121/78 | HR 98 | Temp 99.6°F | Resp 16

## 2023-01-14 DIAGNOSIS — K9186 Retained cholelithiasis following cholecystectomy: Principal | ICD-10-CM | POA: Diagnosis present

## 2023-01-14 DIAGNOSIS — Y733 Surgical instruments, materials and gastroenterology and urology devices (including sutures) associated with adverse incidents: Secondary | ICD-10-CM | POA: Diagnosis not present

## 2023-01-14 DIAGNOSIS — K5909 Other constipation: Secondary | ICD-10-CM | POA: Diagnosis not present

## 2023-01-14 DIAGNOSIS — Z8249 Family history of ischemic heart disease and other diseases of the circulatory system: Secondary | ICD-10-CM

## 2023-01-14 DIAGNOSIS — K8050 Calculus of bile duct without cholangitis or cholecystitis without obstruction: Secondary | ICD-10-CM

## 2023-01-14 DIAGNOSIS — Z833 Family history of diabetes mellitus: Secondary | ICD-10-CM | POA: Diagnosis not present

## 2023-01-14 DIAGNOSIS — Z87891 Personal history of nicotine dependence: Secondary | ICD-10-CM

## 2023-01-14 DIAGNOSIS — R112 Nausea with vomiting, unspecified: Secondary | ICD-10-CM | POA: Diagnosis not present

## 2023-01-14 DIAGNOSIS — R1011 Right upper quadrant pain: Principal | ICD-10-CM

## 2023-01-14 DIAGNOSIS — E669 Obesity, unspecified: Secondary | ICD-10-CM | POA: Diagnosis present

## 2023-01-14 DIAGNOSIS — K8020 Calculus of gallbladder without cholecystitis without obstruction: Secondary | ICD-10-CM | POA: Diagnosis not present

## 2023-01-14 DIAGNOSIS — K8062 Calculus of gallbladder and bile duct with acute cholecystitis without obstruction: Secondary | ICD-10-CM | POA: Diagnosis not present

## 2023-01-14 DIAGNOSIS — Y838 Other surgical procedures as the cause of abnormal reaction of the patient, or of later complication, without mention of misadventure at the time of the procedure: Secondary | ICD-10-CM | POA: Diagnosis not present

## 2023-01-14 DIAGNOSIS — K8070 Calculus of gallbladder and bile duct without cholecystitis without obstruction: Secondary | ICD-10-CM | POA: Diagnosis not present

## 2023-01-14 DIAGNOSIS — R7401 Elevation of levels of liver transaminase levels: Secondary | ICD-10-CM | POA: Diagnosis not present

## 2023-01-14 DIAGNOSIS — K838 Other specified diseases of biliary tract: Secondary | ICD-10-CM | POA: Diagnosis not present

## 2023-01-14 DIAGNOSIS — K8065 Calculus of gallbladder and bile duct with chronic cholecystitis with obstruction: Principal | ICD-10-CM | POA: Diagnosis present

## 2023-01-14 DIAGNOSIS — Z6835 Body mass index (BMI) 35.0-35.9, adult: Secondary | ICD-10-CM | POA: Diagnosis not present

## 2023-01-14 DIAGNOSIS — K828 Other specified diseases of gallbladder: Secondary | ICD-10-CM | POA: Diagnosis not present

## 2023-01-14 DIAGNOSIS — R748 Abnormal levels of other serum enzymes: Secondary | ICD-10-CM

## 2023-01-14 DIAGNOSIS — K8010 Calculus of gallbladder with chronic cholecystitis without obstruction: Secondary | ICD-10-CM | POA: Diagnosis present

## 2023-01-14 DIAGNOSIS — K810 Acute cholecystitis: Secondary | ICD-10-CM | POA: Diagnosis present

## 2023-01-14 LAB — COMPREHENSIVE METABOLIC PANEL
ALT: 383 U/L — ABNORMAL HIGH (ref 0–44)
ALT: 636 U/L — ABNORMAL HIGH (ref 0–44)
AST: 447 U/L — ABNORMAL HIGH (ref 15–41)
AST: 639 U/L — ABNORMAL HIGH (ref 15–41)
Albumin: 3.7 g/dL (ref 3.5–5.0)
Albumin: 3.6 g/dL (ref 3.5–5.0)
Alkaline Phosphatase: 185 U/L — ABNORMAL HIGH (ref 38–126)
Alkaline Phosphatase: 171 U/L — ABNORMAL HIGH (ref 38–126)
Anion gap: 12 (ref 5–15)
Anion gap: 10 (ref 5–15)
BUN: 7 mg/dL (ref 6–20)
BUN: 6 mg/dL (ref 6–20)
CO2: 25 mmol/L (ref 22–32)
CO2: 23 mmol/L (ref 22–32)
Calcium: 8.8 mg/dL — ABNORMAL LOW (ref 8.9–10.3)
Calcium: 8.7 mg/dL — ABNORMAL LOW (ref 8.9–10.3)
Chloride: 98 mmol/L (ref 98–111)
Chloride: 105 mmol/L (ref 98–111)
Creatinine, Ser: 1.01 mg/dL — ABNORMAL HIGH (ref 0.44–1.00)
Creatinine, Ser: 0.79 mg/dL (ref 0.44–1.00)
GFR, Estimated: >60 mL/min (ref >60)
GFR, Estimated: >60 mL/min (ref >60)
Glucose, Bld: 126 mg/dL — ABNORMAL HIGH (ref 70–99)
Glucose, Bld: 121 mg/dL — ABNORMAL HIGH (ref 70–99)
Potassium: 3.6 mmol/L (ref 3.5–5.1)
Potassium: 3.4 mmol/L — ABNORMAL LOW (ref 3.5–5.1)
Sodium: 135 mmol/L (ref 135–145)
Sodium: 138 mmol/L (ref 135–145)
Total Bilirubin: 2.5 mg/dL — ABNORMAL HIGH (ref 0.3–1.2)
Total Bilirubin: 2.7 mg/dL — ABNORMAL HIGH (ref 0.3–1.2)
Total Protein: 7 g/dL (ref 6.5–8.1)
Total Protein: 6.7 g/dL (ref 6.5–8.1)

## 2023-01-14 LAB — URINALYSIS, ROUTINE W REFLEX MICROSCOPIC
Bilirubin Urine: NEGATIVE
Glucose, UA: NEGATIVE mg/dL
Hgb urine dipstick: NEGATIVE
Ketones, ur: 5 mg/dL — AB
Leukocytes,Ua: NEGATIVE
Nitrite: NEGATIVE
Protein, ur: NEGATIVE mg/dL
Specific Gravity, Urine: 1.009 (ref 1.005–1.030)
pH: 6 (ref 5.0–8.0)

## 2023-01-14 LAB — CBC WITH DIFFERENTIAL/PLATELET
Abs Immature Granulocytes: 0.03 10*3/uL (ref 0.00–0.07)
Abs Immature Granulocytes: 0.03 10*3/uL (ref 0.00–0.07)
Basophils Absolute: 0 10*3/uL (ref 0.0–0.1)
Basophils Absolute: 0 10*3/uL (ref 0.0–0.1)
Basophils Relative: 0 %
Basophils Relative: 0 %
Eosinophils Absolute: 0 10*3/uL (ref 0.0–0.5)
Eosinophils Absolute: 0 10*3/uL (ref 0.0–0.5)
Eosinophils Relative: 0 %
Eosinophils Relative: 0 %
HCT: 37.8 % (ref 36.0–46.0)
HCT: 36.7 % (ref 36.0–46.0)
Hemoglobin: 12.8 g/dL (ref 12.0–15.0)
Hemoglobin: 12.4 g/dL (ref 12.0–15.0)
Immature Granulocytes: 0 %
Immature Granulocytes: 0 %
Lymphocytes Relative: 4 %
Lymphocytes Relative: 4 %
Lymphs Abs: 0.4 10*3/uL — ABNORMAL LOW (ref 0.7–4.0)
Lymphs Abs: 0.4 10*3/uL — ABNORMAL LOW (ref 0.7–4.0)
MCH: 29 pg (ref 26.0–34.0)
MCH: 29.8 pg (ref 26.0–34.0)
MCHC: 33.9 g/dL (ref 30.0–36.0)
MCHC: 33.8 g/dL (ref 30.0–36.0)
MCV: 85.5 fL (ref 80.0–100.0)
MCV: 88.2 fL (ref 80.0–100.0)
Monocytes Absolute: 0.8 10*3/uL (ref 0.1–1.0)
Monocytes Absolute: 0.5 10*3/uL (ref 0.1–1.0)
Monocytes Relative: 8 %
Monocytes Relative: 5 %
Neutro Abs: 9.1 10*3/uL — ABNORMAL HIGH (ref 1.7–7.7)
Neutro Abs: 9 10*3/uL — ABNORMAL HIGH (ref 1.7–7.7)
Neutrophils Relative %: 88 %
Neutrophils Relative %: 91 %
Platelets: 335 10*3/uL (ref 150–400)
Platelets: 330 10*3/uL (ref 150–400)
RBC: 4.42 MIL/uL (ref 3.87–5.11)
RBC: 4.16 MIL/uL (ref 3.87–5.11)
RDW: 13 % (ref 11.5–15.5)
RDW: 13.2 % (ref 11.5–15.5)
WBC: 10.3 10*3/uL (ref 4.0–10.5)
WBC: 10.1 10*3/uL (ref 4.0–10.5)
nRBC: 0 % (ref 0.0–0.2)
nRBC: 0 % (ref 0.0–0.2)

## 2023-01-14 LAB — HIV ANTIBODY (ROUTINE TESTING W REFLEX): HIV Screen 4th Generation wRfx: NONREACTIVE

## 2023-01-14 LAB — PREGNANCY, URINE: Preg Test, Ur: NEGATIVE

## 2023-01-14 LAB — HEPATITIS PANEL, ACUTE
HCV Ab: NONREACTIVE
Hep A IgM: NONREACTIVE
Hep B C IgM: NONREACTIVE
Hepatitis B Surface Ag: NONREACTIVE

## 2023-01-14 LAB — LIPASE, BLOOD: Lipase: 34 U/L (ref 11–51)

## 2023-01-14 MED ORDER — SENNA 8.6 MG PO TABS
1.0000 | ORAL_TABLET | Freq: Two times a day (BID) | ORAL | Status: DC
  Administered 2023-01-14 – 2023-01-20 (×8): 8.6 mg via ORAL
  Filled 2023-01-14 (×9): qty 1

## 2023-01-14 MED ORDER — ACETAMINOPHEN 500 MG PO TABS
1000.0000 mg | ORAL_TABLET | Freq: Once | ORAL | Status: AC
  Administered 2023-01-14: 1000 mg via ORAL
  Filled 2023-01-14: qty 2

## 2023-01-14 MED ORDER — SIMETHICONE 80 MG PO CHEW
40.0000 mg | CHEWABLE_TABLET | Freq: Four times a day (QID) | ORAL | Status: DC | PRN
  Administered 2023-01-16: 40 mg via ORAL
  Filled 2023-01-14 (×2): qty 1

## 2023-01-14 MED ORDER — DIPHENHYDRAMINE HCL 25 MG PO CAPS: 25.0000 mg | ORAL_CAPSULE | Freq: Four times a day (QID) | ORAL | Status: DC | PRN

## 2023-01-14 MED ORDER — HYDROMORPHONE HCL 1 MG/ML IJ SOLN
0.5000 mg | INTRAMUSCULAR | Status: DC | PRN
  Administered 2023-01-14 – 2023-01-15 (×3): 0.5 mg via INTRAVENOUS
  Filled 2023-01-14 (×3): qty 0.5

## 2023-01-14 MED ORDER — ENOXAPARIN SODIUM 40 MG/0.4ML IJ SOSY
40.0000 mg | PREFILLED_SYRINGE | INTRAMUSCULAR | Status: AC
  Administered 2023-01-16 – 2023-01-17 (×2): 40 mg via SUBCUTANEOUS
  Filled 2023-01-14 (×2): qty 0.4

## 2023-01-14 MED ORDER — ONDANSETRON HCL 4 MG/2ML IJ SOLN
4.0000 mg | Freq: Four times a day (QID) | INTRAMUSCULAR | Status: DC | PRN
  Administered 2023-01-15 – 2023-01-18 (×3): 4 mg via INTRAVENOUS
  Filled 2023-01-14 (×3): qty 2

## 2023-01-14 MED ORDER — LACTATED RINGERS IV BOLUS: 1000.0000 mL | Freq: Once | INTRAVENOUS | Status: DC

## 2023-01-14 MED ORDER — ACETAMINOPHEN 650 MG RE SUPP: 650.0000 mg | Freq: Four times a day (QID) | RECTAL | Status: DC | PRN

## 2023-01-14 MED ORDER — KCL IN DEXTROSE-NACL 20-5-0.45 MEQ/L-%-% IV SOLN
INTRAVENOUS | Status: DC
  Filled 2023-01-14 (×6): qty 1000

## 2023-01-14 MED ORDER — METOPROLOL TARTRATE 5 MG/5ML IV SOLN: 5.0000 mg | Freq: Four times a day (QID) | INTRAVENOUS | Status: DC | PRN

## 2023-01-14 MED ORDER — PIPERACILLIN-TAZOBACTAM 3.375 G IVPB 30 MIN
3.3750 g | Freq: Once | INTRAVENOUS | Status: AC
  Administered 2023-01-14: 3.375 g via INTRAVENOUS
  Filled 2023-01-14: qty 50

## 2023-01-14 MED ORDER — PIPERACILLIN-TAZOBACTAM 3.375 G IVPB
3.3750 g | Freq: Three times a day (TID) | INTRAVENOUS | Status: DC
  Administered 2023-01-14 – 2023-01-20 (×17): 3.375 g via INTRAVENOUS
  Filled 2023-01-14 (×17): qty 50

## 2023-01-14 MED ORDER — ACETAMINOPHEN 325 MG PO TABS: 650.0000 mg | ORAL_TABLET | Freq: Four times a day (QID) | ORAL | Status: DC | PRN

## 2023-01-14 MED ORDER — DIPHENHYDRAMINE HCL 50 MG/ML IJ SOLN: 25.0000 mg | Freq: Four times a day (QID) | INTRAMUSCULAR | Status: DC | PRN

## 2023-01-14 MED ORDER — ONDANSETRON 4 MG PO TBDP: 4.0000 mg | ORAL_TABLET | Freq: Four times a day (QID) | ORAL | Status: DC | PRN

## 2023-01-14 MED ORDER — ONDANSETRON 4 MG PO TBDP
8.0000 mg | ORAL_TABLET | Freq: Once | ORAL | Status: AC
  Administered 2023-01-14: 8 mg via ORAL
  Filled 2023-01-14: qty 2

## 2023-01-14 MED ORDER — OXYCODONE HCL 5 MG PO TABS
5.0000 mg | ORAL_TABLET | ORAL | Status: DC | PRN
  Administered 2023-01-16: 10 mg via ORAL
  Filled 2023-01-14: qty 2

## 2023-01-14 MED ORDER — POLYETHYLENE GLYCOL 3350 17 G PO PACK: 17.0000 g | PACK | Freq: Every day | ORAL | Status: DC | PRN

## 2023-01-14 NOTE — ED Triage Notes (Signed)
Patient reports that she has had mid upper abdominal pain that radiates into the mid aback x 5 days. Patient states the pain is worse at night. Patient states she is very "gassy and no BM in 2 days." Patient also reports intermittent vomiting of "bile" Patient states today, she has had leg pain and chills.   Patient states she has used "hot packs to help with abdominal and back pain.

## 2023-01-14 NOTE — ED Notes (Signed)
ED TO INPATIENT HANDOFF REPORT  ED Nurse Name and Phone #: Tori [phone]  S Name/Age/Gender Betty Sellers 47 y.o. female Room/Bed: H011C/H011C  Code Status   Code Status: Full Code  Home/SNF/Other Home Patient oriented to: self, place, time, and situation Is this baseline? Yes   Triage Complete: Triage complete  Chief Complaint Acute cholecystitis [K81.0]  Triage Note Pt here sent down from UC with c/o RUQ abd pain along with pain to her legs, some nausea and vomiting this am    Allergies No Known Allergies  Level of Care/Admitting Diagnosis ED Disposition     ED Disposition  Admit   Condition  --   Comment  Hospital Area: [facility] [[id number]]  Level of Care: Med-Surg [16]  May place patient in observation at [facility] or [facility] if equivalent level of care is available:: No  Covid Evaluation: Asymptomatic - no recent exposure (last 10 days) testing not required  Diagnosis: Acute cholecystitis [575.0.ICD-9-CM]  Admitting Physician: CCS, MD [[id number]]  Attending Physician: CCS, MD [[id number]]  Bed request comments: 6N          B Medical/Surgery History History reviewed. No pertinent past medical history. Past Surgical History:  Procedure Laterality Date   CESAREAN SECTION  2010   CESAREAN SECTION  2012     A IV Location/Drains/Wounds Patient Lines/Drains/Airways Status     Active Line/Drains/Airways     Name Placement date Placement time Site Days   Peripheral IV 01/14/23 20 G Anterior;Proximal;Right Forearm 01/14/23  1240  Forearm  less than 1            Intake/Output Last 24 hours No intake or output data in the 24 hours ending 01/14/23 1615  Labs/Imaging Results for orders placed or performed during the hospital encounter of 01/14/23 (from the past 48 hour(s))  Lipase, blood     Status: None   Collection Time: 01/14/23 12:29 PM  Result Value Ref Range   Lipase 34 11 - 51 U/L    Comment: Performed at [facility]  Lab, [address], [place], [place] [address]  Pregnancy, urine     Status: None   Collection Time: 01/14/23 12:29 PM  Result Value Ref Range   Preg Test, Ur NEGATIVE NEGATIVE    Comment:        THE SENSITIVITY OF THIS METHODOLOGY IS >25 mIU/mL. Performed at [facility] Lab, [address], [place], [place] [address]   Urinalysis, Routine w reflex microscopic -Urine, Clean Catch     Status: Abnormal   Collection Time: 01/14/23 12:29 PM  Result Value Ref Range   Color, Urine AMBER (A) YELLOW    Comment: BIOCHEMICALS MAY BE AFFECTED BY COLOR   APPearance CLEAR CLEAR   Specific Gravity, Urine 1.009 1.005 - 1.030   pH 6.0 5.0 - 8.0   Glucose, UA NEGATIVE NEGATIVE mg/dL   Hgb urine dipstick NEGATIVE NEGATIVE   Bilirubin Urine NEGATIVE NEGATIVE   Ketones, ur 5 (A) NEGATIVE mg/dL   Protein, ur NEGATIVE NEGATIVE mg/dL   Nitrite NEGATIVE NEGATIVE   Leukocytes,Ua NEGATIVE NEGATIVE    Comment: Performed at [facility] Lab, [address] [place], [place] [address]   US Abdomen Limited RUQ (LIVER/GB)  Result Date: 01/14/2023 CLINICAL DATA:  Right upper quadrant pain. EXAM: ULTRASOUND ABDOMEN LIMITED RIGHT UPPER QUADRANT COMPARISON:  None Available. FINDINGS: Gallbladder: Gallstones and gallbladder sludge are noted. The gallbladder wall is borderline thickened. There is a positive sonographic Murphy sign  noted by sonographer. Common bile duct: Diameter: 7 mm Liver: No focal lesion identified. Within normal limits in parenchymal echogenicity. Portal vein is patent on color Doppler imaging with normal direction of blood flow towards the liver. Other: None. IMPRESSION: Cholelithiasis with borderline gallbladder wall thickening and a positive sonographic Murphy sign. Findings are suspicious for acute cholecystitis. Electronically Signed   By: Romona Curls M.D.   On: 01/14/2023 14:23    Pending Labs Unresulted Labs (From admission, onward)     Start     Ordered   01/21/23 0500  Creatinine,  serum  (enoxaparin (LOVENOX)    CrCl >/= 30 ml/min)  Weekly,   R     Comments: while on enoxaparin therapy    01/14/23 1613   01/15/23 0500  Comprehensive metabolic panel  Tomorrow morning,   R        01/14/23 1613   01/15/23 0500  CBC  Tomorrow morning,   R        01/14/23 1613   01/15/23 0500  Protime-INR  Tomorrow morning,   R        01/14/23 1613   01/14/23 1613  Hepatitis panel, acute  Once,   R        01/14/23 1613   01/14/23 1610  CBC  (enoxaparin (LOVENOX)    CrCl >/= 30 ml/min)  Once,   R       Comments: Baseline for enoxaparin therapy IF NOT ALREADY DRAWN.  Notify MD if PLT < 100 K.    01/14/23 1613   01/14/23 1609  HIV Antibody (routine testing w rflx)  (HIV Antibody (Routine testing w reflex) panel)  Once,   R        01/14/23 1613   01/14/23 1529  CBC with Differential  Once,   STAT        01/14/23 1528   01/14/23 1529  Comprehensive metabolic panel  Once,   STAT        01/14/23 1528            Vitals/Pain Today's Vitals   01/14/23 1223 01/14/23 1224 01/14/23 1226  BP:  130/86   Pulse:  100   Resp:  18   Temp:   (!) 100.9 F (38.3 C)  TempSrc:   Oral  SpO2:  100%   PainSc: 1       Isolation Precautions No active isolations  Medications Medications  piperacillin-tazobactam (ZOSYN) IVPB 3.375 g (has no administration in time range)  lactated ringers bolus 1,000 mL (has no administration in time range)  enoxaparin (LOVENOX) injection 40 mg (has no administration in time range)  dextrose 5 % and 0.45 % NaCl with KCl 20 mEq/L infusion (has no administration in time range)  piperacillin-tazobactam (ZOSYN) IVPB 3.375 g (has no administration in time range)  acetaminophen (TYLENOL) tablet 650 mg (has no administration in time range)    Or  acetaminophen (TYLENOL) suppository 650 mg (has no administration in time range)  oxyCODONE (Oxy IR/ROXICODONE) immediate release tablet 5-10 mg (has no administration in time range)  HYDROmorphone (DILAUDID) injection  0.5 mg (has no administration in time range)  diphenhydrAMINE (BENADRYL) capsule 25 mg (has no administration in time range)    Or  diphenhydrAMINE (BENADRYL) injection 25 mg (has no administration in time range)  senna (SENOKOT) tablet 8.6 mg (has no administration in time range)  polyethylene glycol (MIRALAX / GLYCOLAX) packet 17 g (has no administration in time range)  ondansetron (ZOFRAN-ODT) disintegrating tablet 4 mg (  has no administration in time range)    Or  ondansetron (ZOFRAN) injection 4 mg (has no administration in time range)  simethicone (MYLICON) chewable tablet 40 mg (has no administration in time range)  metoprolol tartrate (LOPRESSOR) injection 5 mg (has no administration in time range)  acetaminophen (TYLENOL) tablet 1,000 mg (1,000 mg Oral Given 01/14/23 1249)  ondansetron (ZOFRAN-ODT) disintegrating tablet 8 mg (8 mg Oral Given 01/14/23 1249)    Mobility walks     Focused Assessments Abd    R Recommendations: See Admitting Provider Note  Report given to:   Additional Notes: pt axox4, VSS

## 2023-01-14 NOTE — ED Triage Notes (Signed)
Pt here sent down from UC with c/o RUQ abd pain along with pain to her legs, some nausea and vomiting this am

## 2023-01-14 NOTE — Progress Notes (Signed)
Pt arrived to 6 north room 3 alert and oriented x4. Pain level 6/10. Pt ambulated from stretcher to bed independently with no issues. Bed in lowest position. Call light in reach. Husband at bedside. Will continue to monitor pt.

## 2023-01-14 NOTE — ED Provider Notes (Signed)
[place] EMERGENCY DEPARTMENT AT [facility] Provider Note   CSN: [id number] Arrival date & time: 01/14/23  1214     History  Chief Complaint  Patient presents with   Abdominal Pain    Betty Sellers is a 47 y.o. female.   Abdominal Pain 47 year old female with no significant past medical history presenting for evaluation of abdominal pain.  Patient states that for the last 5 days she has had intermittent epigastric and right upper quadrant abdominal pain.  She notices the pain is worse with eating.  Intermittent nausea and vomiting are mention.  Patient went to urgent care earlier today where she was noted to have a slight fever.  She was sent from urgent care to the emergency department for continued evaluation.  Patient denies any recent chest pain, shortness of breath.  Has never had pain like this before.     Home Medications Prior to Admission medications   Not on File      Allergies    Patient has no known allergies.    Review of Systems   Review of Systems  Gastrointestinal:  Positive for abdominal pain.    Physical Exam Updated Vital Signs BP 107/68 (BP Location: Left Arm)   Pulse 85   Temp 100.2 F (37.9 C) (Oral)   Resp 18   LMP 01/10/2023   SpO2 100%  Physical Exam HENT:     Head: Normocephalic and atraumatic.     Mouth/Throat:     Mouth: Mucous membranes are moist.  Eyes:     Extraocular Movements: Extraocular movements intact.  Cardiovascular:     Rate and Rhythm: Regular rhythm. Tachycardia present.  Pulmonary:     Effort: Pulmonary effort is normal. No respiratory distress.     Breath sounds: No wheezing.  Abdominal:     General: Abdomen is flat.     Palpations: Abdomen is soft.     Tenderness: There is abdominal tenderness in the right upper quadrant and epigastric area. There is no guarding or rebound. Negative signs include McBurney's sign.  Skin:    General: Skin is warm and dry.     Findings: No rash.  Neurological:      General: No focal deficit present.     Mental Status: She is alert.     ED Results / Procedures / Treatments   Labs (all labs ordered are listed, but only abnormal results are displayed) Labs Reviewed  URINALYSIS, ROUTINE W REFLEX MICROSCOPIC - Abnormal; Notable for the following components:      Result Value   Color, Urine AMBER (*)    Ketones, ur 5 (*)    All other components within normal limits  CBC WITH DIFFERENTIAL/PLATELET - Abnormal; Notable for the following components:   Neutro Abs 9.0 (*)    Lymphs Abs 0.4 (*)    All other components within normal limits  COMPREHENSIVE METABOLIC PANEL - Abnormal; Notable for the following components:   Potassium 3.4 (*)    Glucose, Bld 121 (*)    Calcium 8.7 (*)    AST 639 (*)    ALT 636 (*)    Alkaline Phosphatase 171 (*)    Total Bilirubin 2.7 (*)    All other components within normal limits  LIPASE, BLOOD  PREGNANCY, URINE  HIV ANTIBODY (ROUTINE TESTING W REFLEX)  CBC  HEPATITIS PANEL, ACUTE  COMPREHENSIVE METABOLIC PANEL  CBC  PROTIME-INR    EKG None  Radiology US Abdomen Limited RUQ (LIVER/GB)  Result Date: 01/14/2023  CLINICAL DATA:  Right upper quadrant pain. EXAM: ULTRASOUND ABDOMEN LIMITED RIGHT UPPER QUADRANT COMPARISON:  None Available. FINDINGS: Gallbladder: Gallstones and gallbladder sludge are noted. The gallbladder wall is borderline thickened. There is a positive sonographic Murphy sign noted by sonographer. Common bile duct: Diameter: 7 mm Liver: No focal lesion identified. Within normal limits in parenchymal echogenicity. Portal vein is patent on color Doppler imaging with normal direction of blood flow towards the liver. Other: None. IMPRESSION: Cholelithiasis with borderline gallbladder wall thickening and a positive sonographic Murphy sign. Findings are suspicious for acute cholecystitis. Electronically Signed   By: Romona Curls M.D.   On: 01/14/2023 14:23    Procedures Procedures    Medications Ordered  in ED Medications  lactated ringers bolus 1,000 mL (1,000 mLs Intravenous Not Given 01/14/23 1545)  enoxaparin (LOVENOX) injection 40 mg (has no administration in time range)  dextrose 5 % and 0.45 % NaCl with KCl 20 mEq/L infusion ( Intravenous New Bag/Given 01/14/23 1714)  piperacillin-tazobactam (ZOSYN) IVPB 3.375 g (has no administration in time range)  acetaminophen (TYLENOL) tablet 650 mg (has no administration in time range)    Or  acetaminophen (TYLENOL) suppository 650 mg (has no administration in time range)  oxyCODONE (Oxy IR/ROXICODONE) immediate release tablet 5-10 mg (has no administration in time range)  HYDROmorphone (DILAUDID) injection 0.5 mg (0.5 mg Intravenous Given 01/14/23 1715)  diphenhydrAMINE (BENADRYL) capsule 25 mg (has no administration in time range)    Or  diphenhydrAMINE (BENADRYL) injection 25 mg (has no administration in time range)  senna (SENOKOT) tablet 8.6 mg (has no administration in time range)  polyethylene glycol (MIRALAX / GLYCOLAX) packet 17 g (has no administration in time range)  ondansetron (ZOFRAN-ODT) disintegrating tablet 4 mg (has no administration in time range)    Or  ondansetron (ZOFRAN) injection 4 mg (has no administration in time range)  simethicone (MYLICON) chewable tablet 40 mg (has no administration in time range)  metoprolol tartrate (LOPRESSOR) injection 5 mg (has no administration in time range)  acetaminophen (TYLENOL) tablet 1,000 mg (1,000 mg Oral Given 01/14/23 1249)  ondansetron (ZOFRAN-ODT) disintegrating tablet 8 mg (8 mg Oral Given 01/14/23 1249)  piperacillin-tazobactam (ZOSYN) IVPB 3.375 g (3.375 g Intravenous New Bag/Given 01/14/23 1715)    ED Course/ Medical Decision Making/ A&P                                 Medical Decision Making Amount and/or Complexity of Data Reviewed Labs: ordered.  Risk Prescription drug management. Decision regarding hospitalization.   47 year old female with past medical history and  HPI as above.  On arrival, patient is notably febrile at 100.9 F and mildly tachycardic with a rate of 100.  Vital signs are otherwise largely unremarkable.  Patient's lab work shows a mild leukocytosis at 10.1 with neutrophilic predominance.  On metabolic panel, her LFTs are also elevated with an AST of 639, ALT of 636, alk phos of 171, and elevated total bilirubin at 2.7.  Right upper quadrant ultrasound was obtained for better characterization of her pain.  This returned showing cholelithiasis with gallbladder wall thickening and positive sonographic Murphy.  Findings were noted to be suspicious for acute cholecystitis.  Given these ultrasound findings in conjunction with patient's lab work, cholecystitis is of real concern.  Patient will be started on empiric antibiotics as well as IV fluids.  General surgery team was consulted was agreed to admission.  Patient required  no additional acute intervention prior to transitioning to the inpatient team.     Final Clinical Impression(s) / ED Diagnoses Final diagnoses:  Right upper quadrant abdominal pain    Rx / DC Orders ED Discharge Orders     None         Lyman Speller, MD 01/14/23 1842    Gerhard Munch, MD 01/15/23 2250

## 2023-01-14 NOTE — Discharge Instructions (Addendum)
Your liver enzymes are elevated and the recommendation is for you to go to the emergency room to have the ultrasound completed today versus waiting until tomorrow just in case additional treatment needs to be done at this time.

## 2023-01-14 NOTE — ED Provider Notes (Signed)
[facility]    CSN: [id number] Arrival date & time: 01/14/23  4098      History   Chief Complaint Chief Complaint  Patient presents with   Abdominal Pain    Since Wednesday last week, I've been having pain at the top of my stomach all the way to my back. I vomited some of the days, pain persists today. - Entered by patient   Emesis   Chills   Generalized Body Aches    HPI Betty Sellers is a 47 y.o. female.   HPI  Patient 47 y.o. female presents with abdominal pain. Onset of symptoms was  5 days ago with gradually worsening course since that time. The pain is located in the upper abdomen with radiation to back. Patient describes the pain as aching and cramping, intermittent, occurring about every day and lasting minutes to hours per episode and rated as moderate. Pain has been associated with constipation, dysphagia, nausea, and vomiting. Patient denies diarrhea and melena. Symptoms are aggravated by eating. Symptoms improve with none. Past history includes  none. Previous studies include none . History reviewed. No pertinent past medical history.  Patient Active Problem List   Diagnosis Date Noted   Weight loss 05/01/2021   Elevated fasting glucose 04/10/2021   Body mass index (BMI) of 40.1-44.9 in adult [facility]) 02/08/2021    Past Surgical History:  Procedure Laterality Date   CESAREAN SECTION  2010   CESAREAN SECTION  2012    OB History   No obstetric history on file.      Home Medications    Prior to Admission medications   Not on File    Family History Family History  Problem Relation Age of Onset   Cancer Father    Heart failure Father    Diabetes Other     Social History Social History   Tobacco Use   Smoking status: Former    Current packs/day: 0.00    Types: Cigarettes    Quit date: 2009    Years since quitting: 15.7   Smokeless tobacco: Never  Vaping Use   Vaping status: Never Used  Substance Use Topics   Alcohol use: Yes    Drug use: Never     Allergies   Patient has no known allergies.   Review of Systems Review of Systems   Physical Exam Triage Vital Signs ED Triage Vitals  Encounter Vitals Group     BP 01/14/23 1026 121/78     Systolic BP Percentile --      Diastolic BP Percentile --      Pulse Rate 01/14/23 1026 98     Resp 01/14/23 1026 16     Temp 01/14/23 1026 99.6 F (37.6 C)     Temp Source 01/14/23 1026 Oral     SpO2 01/14/23 1026 96 %     Weight --      Height --      Head Circumference --      Peak Flow --      Pain Score 01/14/23 1027 6     Pain Loc --      Pain Education --      Exclude from Growth Chart --    No data found.  Updated Vital Signs BP 121/78 (BP Location: Left Arm)   Pulse 98   Temp 99.6 F (37.6 C) (Oral)   Resp 16   LMP 01/10/2023   SpO2 96%   Visual Acuity Right Eye Distance:  Left Eye Distance:   Bilateral Distance:    Right Eye Near:   Left Eye Near:    Bilateral Near:     Physical Exam Constitutional:      General: She is not in acute distress.    Appearance: She is obese.  HENT:     Head: Normocephalic.     Mouth/Throat:     Mouth: Mucous membranes are moist.  Eyes:     Extraocular Movements: Extraocular movements intact.     Pupils: Pupils are equal, round, and reactive to light.  Cardiovascular:     Rate and Rhythm: Normal rate.  Pulmonary:     Effort: Pulmonary effort is normal.  Abdominal:     General: Abdomen is flat. Bowel sounds are decreased.     Palpations: Abdomen is soft.     Tenderness: There is abdominal tenderness in the right upper quadrant. There is no right CVA tenderness or left CVA tenderness. Positive signs include Murphy's sign.     Hernia: No hernia is present.  Skin:    General: Skin is warm.     Capillary Refill: Capillary refill takes less than 2 seconds.     Coloration: Skin is jaundiced (around the abdomen).  Neurological:     General: No focal deficit present.     Mental Status: She is alert  and oriented to person, place, and time.      UC Treatments / Results  Labs (all labs ordered are listed, but only abnormal results are displayed) Labs Reviewed  COMPREHENSIVE METABOLIC PANEL - Abnormal; Notable for the following components:      Result Value   Glucose, Bld 126 (*)    Creatinine, Ser 1.01 (*)    Calcium 8.8 (*)    AST 447 (*)    ALT 383 (*)    Alkaline Phosphatase 185 (*)    Total Bilirubin 2.5 (*)    All other components within normal limits  CBC WITH DIFFERENTIAL/PLATELET - Abnormal; Notable for the following components:   Neutro Abs 9.1 (*)    Lymphs Abs 0.4 (*)    All other components within normal limits    EKG   Studies: Imaging   Radiology No results found.  Procedures Procedures (including critical care time)  Medications Ordered in UC Medications - No data to display  Initial Impression / Assessment and Plan / UC Course  I have reviewed the triage vital signs and the nursing notes.  Pertinent labs & imaging results that were available during my care of the patient were reviewed by me and considered in my medical decision making (see chart for details).    Abdominal pain with nausea vomiting Final Clinical Impressions(s) / UC Diagnoses   Final diagnoses:  Abdominal pain, right upper quadrant  Nausea and vomiting, unspecified vomiting type  Elevated liver enzymes     Discharge Instructions      Your liver enzymes are elevated and the recommendation is for you to go to the emergency room to have the ultrasound completed today versus waiting until tomorrow just in case additional treatment needs to be done at this time.     ED Prescriptions   None    PDMP not reviewed this encounter.   Thad [place], [place] 01/14/23 [phone]

## 2023-01-14 NOTE — H&P (Signed)
H&P Note  Betty Sellers 08/10/1975  [id number].    Requesting MD: Gerhard Munch, MD Chief Complaint/Reason for Consult: Acute cholecystitis   HPI:  Patient is a 47 year old female with no significant PMH who presented to urgent care earlier today with abdominal pain x5 days. Pain is located in her epigastrium/ruq and radiating to the back and has progressively worsened since onset. Pain was initially intermittent, worsened by PO intake but is now constant. Associated symptoms include fever, nausea, vomiting, chills and constipation. Still passing flatus. Last bm 2 days ago. No urinary symptoms. Reports hx of similar symptoms 1 year ago that was self limited. Has never been told she had gallstones before. No hx of pancreatitis. She is not on Ozempic or [id number]. No frequent NSAID use. Occasional alcohol use, none recently. Recent travel to [place] ~4 weeks ago but no GI symptoms reported until 5 days ago. Prior abdominal surgery includes cesarean x2. NKDA and not on blood thinners.   ROS: Negative other than HPI  Family History  Problem Relation Age of Onset   Cancer Father    Heart failure Father    Diabetes Other     History reviewed. No pertinent past medical history.  Past Surgical History:  Procedure Laterality Date   CESAREAN SECTION  2010   CESAREAN SECTION  2012    Social History:  reports that she quit smoking about 15 years ago. Her smoking use included cigarettes. She has never used smokeless tobacco. She reports current alcohol use. She reports that she does not use drugs.  Allergies: No Known Allergies  (Not in a hospital admission)   Blood pressure 130/86, pulse 100, temperature (!) 100.9 F (38.3 C), temperature source Oral, resp. rate 18, last menstrual period 01/10/2023, SpO2 100%. Physical Exam:  General: pleasant, WD, female who is laying in bed in NAD HEENT: head is normocephalic, atraumatic.  Sclera are anicteric.  EOMI.  Ears and nose without  any masses or lesions.  Mouth is pink and moist Heart: regular, rate, and rhythm.  Lungs: CTAB, no wheezes, rhonchi, or rales noted.  Respiratory effort nonlabored Abd: Soft, TTP in epigastric and RUQ with negative Murphy sign, ND, +BS, no masses, hernias, or organomegaly. Otherwise NT MS: No LE edema Skin: warm and dry with no masses, lesions, or rashes Neuro: Cranial nerves 2-12 grossly intact, sensation is normal throughout Psych: A&Ox3 with an appropriate affect.   Results for orders placed or performed during the hospital encounter of 01/14/23 (from the past 48 hour(s))  Lipase, blood     Status: None   Collection Time: 01/14/23 12:29 PM  Result Value Ref Range   Lipase 34 11 - 51 U/L    Comment: Performed at [facility], [address], [place], [place] [address]  Pregnancy, urine     Status: None   Collection Time: 01/14/23 12:29 PM  Result Value Ref Range   Preg Test, Ur NEGATIVE NEGATIVE    Comment:        THE SENSITIVITY OF THIS METHODOLOGY IS >25 mIU/mL. Performed at [facility], [address], [place], [place] [address]   Urinalysis, Routine w reflex microscopic -Urine, Clean Catch     Status: Abnormal   Collection Time: 01/14/23 12:29 PM  Result Value Ref Range   Color, Urine AMBER (A) YELLOW    Comment: BIOCHEMICALS MAY BE AFFECTED BY COLOR   APPearance CLEAR CLEAR   Specific Gravity, Urine 1.009 1.005 - 1.030  pH 6.0 5.0 - 8.0   Glucose, UA NEGATIVE NEGATIVE mg/dL   Hgb urine dipstick NEGATIVE NEGATIVE   Bilirubin Urine NEGATIVE NEGATIVE   Ketones, ur 5 (A) NEGATIVE mg/dL   Protein, ur NEGATIVE NEGATIVE mg/dL   Nitrite NEGATIVE NEGATIVE   Leukocytes,Ua NEGATIVE NEGATIVE    Comment: Performed at [facility], [address], [place], [place] [address]   US Abdomen Limited RUQ (LIVER/GB)  Result Date: 01/14/2023 CLINICAL DATA:  Right upper quadrant pain. EXAM: ULTRASOUND ABDOMEN LIMITED RIGHT UPPER QUADRANT COMPARISON:  None Available.  FINDINGS: Gallbladder: Gallstones and gallbladder sludge are noted. The gallbladder wall is borderline thickened. There is a positive sonographic Murphy sign noted by sonographer. Common bile duct: Diameter: 7 mm Liver: No focal lesion identified. Within normal limits in parenchymal echogenicity. Portal vein is patent on color Doppler imaging with normal direction of blood flow towards the liver. Other: None. IMPRESSION: Cholelithiasis with borderline gallbladder wall thickening and a positive sonographic Murphy sign. Findings are suspicious for acute cholecystitis. Electronically Signed   By: Romona Curls M.D.   On: 01/14/2023 14:23      Assessment/Plan Probable acute cholecystitis  Transaminitis with hyperbilirubinemia  - [place] with cholelithiasis and borderline gallbladder wall thickening, CBD 7 mm, positive sonographic murphy sign - Labs at UC: Alk Phos 185, lipase WNL, AST/ALT 447/383, Tbili 2.5, WBC WNL - Repeat labs pending  - On exam patient TTP in RUQ  - Recommend admission to observation and repeat labs in AM - if Tbili trending up will need MRCP vs GI consult to rule out choledocholithiasis. If LFTs stable to down trending will plan for laparoscopic cholecystectomy with IOC - Will also order hep panel as well to rule out acute hepatitis  -  I have explained the procedure, risks, and aftercare of Laparoscopic cholecystectomy  with IOC.  Risks include but are not limited to anesthesia (MI, CVA, death, prolonged intubation and aspiration), bleeding, infection, wound problems, hernia, bile leak, injury to common bile duct/liver/intestine, possible need for subtotal cholecystectomy or open cholecystectomy, increased risk of DVT/PE and diarrhea post op.  She seems to understand and agrees to proceed pending repeat labs in am.   FEN: CLD, NPO after MN, IVF VTE: SCDs, LMWH ID: [email or web address]  I reviewed ED provider notes, last 24 h vitals and pain scores, last 48 h intake and output, last 24 h labs and  trends, and last 24 h imaging results.  Leary Roca, [facility] 01/14/2023, 3:59 PM Please see Amion for pager number during day hours 7:00am-4:30pm

## 2023-01-14 NOTE — ED Provider Triage Note (Signed)
Emergency Medicine Provider Triage Evaluation Note  Betty Sellers , a 47 y.o. female  was evaluated in triage.  Pt complains of upper abdominal pain, nausea, vomiting.  Patient states he did have a intermittent symptoms since Wednesday of last week.  Anything p.o. has worsened symptoms.  The pain worsened over the past day or so prompting visit to urgent care and subsequently come to the emergency department.  Had labs drawn at urgent care which showed transaminitis, elevation of total bilirubin, alkaline phosphatase.  Denies any known fever but patient with fever today.  Review of Systems  Positive: See above Negative:   Physical Exam  BP 130/86   Pulse 100   Temp (!) 100.9 F (38.3 C) (Oral)   Resp 18   LMP 01/10/2023   SpO2 100%  Gen:   Awake, no distress   Resp:  Normal effort  MSK:   Moves extremities without difficulty  Other:  Right upper quadrant tenderness.  No obvious scleral icterus.  Medical Decision Making  Medically screening exam initiated at 12:33 PM.  Appropriate orders placed.  Betty Sellers was informed that the remainder of the evaluation will be completed by another provider, this initial triage assessment does not replace that evaluation, and the importance of remaining in the ED until their evaluation is complete.     Peter Garter, [place] 01/14/23 1235

## 2023-01-15 ENCOUNTER — Encounter (HOSPITAL_COMMUNITY): Payer: Self-pay

## 2023-01-15 ENCOUNTER — Encounter (HOSPITAL_COMMUNITY): Admission: EM | Disposition: A | Discharge status: Home / Self Care | Payer: Self-pay | Source: Home / Self Care

## 2023-01-15 ENCOUNTER — Observation Stay (HOSPITAL_COMMUNITY): Payer: BC Managed Care – PPO | Admitting: Anesthesiology

## 2023-01-15 ENCOUNTER — Observation Stay (HOSPITAL_COMMUNITY): Payer: BC Managed Care – PPO

## 2023-01-15 ENCOUNTER — Other Ambulatory Visit: Payer: Self-pay

## 2023-01-15 ENCOUNTER — Emergency Department (HOSPITAL_COMMUNITY): Admit: 2023-01-15 | Payer: BC Managed Care – PPO

## 2023-01-15 DIAGNOSIS — K838 Other specified diseases of biliary tract: Secondary | ICD-10-CM | POA: Diagnosis not present

## 2023-01-15 DIAGNOSIS — R1011 Right upper quadrant pain: Secondary | ICD-10-CM | POA: Diagnosis not present

## 2023-01-15 DIAGNOSIS — K828 Other specified diseases of gallbladder: Secondary | ICD-10-CM | POA: Diagnosis not present

## 2023-01-15 DIAGNOSIS — K8062 Calculus of gallbladder and bile duct with acute cholecystitis without obstruction: Secondary | ICD-10-CM | POA: Diagnosis not present

## 2023-01-15 DIAGNOSIS — K8010 Calculus of gallbladder with chronic cholecystitis without obstruction: Secondary | ICD-10-CM | POA: Diagnosis not present

## 2023-01-15 HISTORY — PX: CHOLECYSTECTOMY: SHX55

## 2023-01-15 LAB — COMPREHENSIVE METABOLIC PANEL
ALT: 616 U/L — ABNORMAL HIGH (ref 0–44)
AST: 474 U/L — ABNORMAL HIGH (ref 15–41)
Albumin: 2.9 g/dL — ABNORMAL LOW (ref 3.5–5.0)
Alkaline Phosphatase: 167 U/L — ABNORMAL HIGH (ref 38–126)
Anion gap: 8 (ref 5–15)
BUN: <5 mg/dL — ABNORMAL LOW (ref 6–20)
CO2: 24 mmol/L (ref 22–32)
Calcium: 8.1 mg/dL — ABNORMAL LOW (ref 8.9–10.3)
Chloride: 104 mmol/L (ref 98–111)
Creatinine, Ser: 0.82 mg/dL (ref 0.44–1.00)
GFR, Estimated: >60 mL/min (ref >60)
Glucose, Bld: 107 mg/dL — ABNORMAL HIGH (ref 70–99)
Potassium: 3.3 mmol/L — ABNORMAL LOW (ref 3.5–5.1)
Sodium: 136 mmol/L (ref 135–145)
Total Bilirubin: 2.5 mg/dL — ABNORMAL HIGH (ref 0.3–1.2)
Total Protein: 6 g/dL — ABNORMAL LOW (ref 6.5–8.1)

## 2023-01-15 LAB — CBC
HCT: 33.8 % — ABNORMAL LOW (ref 36.0–46.0)
Hemoglobin: 11.5 g/dL — ABNORMAL LOW (ref 12.0–15.0)
MCH: 29.6 pg (ref 26.0–34.0)
MCHC: 34 g/dL (ref 30.0–36.0)
MCV: 87.1 fL (ref 80.0–100.0)
Platelets: 282 10*3/uL (ref 150–400)
RBC: 3.88 MIL/uL (ref 3.87–5.11)
RDW: 13.3 % (ref 11.5–15.5)
WBC: 7.1 10*3/uL (ref 4.0–10.5)
nRBC: 0 % (ref 0.0–0.2)

## 2023-01-15 LAB — PROTIME-INR
INR: 1.4 — ABNORMAL HIGH (ref 0.8–1.2)
Prothrombin Time: 17 s — ABNORMAL HIGH (ref 11.4–15.2)

## 2023-01-15 SURGERY — LAPAROSCOPIC CHOLECYSTECTOMY WITH INTRAOPERATIVE CHOLANGIOGRAM
Anesthesia: General | Site: Abdomen

## 2023-01-15 MED ORDER — FENTANYL CITRATE (PF) 250 MCG/5ML IJ SOLN
INTRAMUSCULAR | Status: DC | PRN
  Administered 2023-01-15 (×3): 50 ug via INTRAVENOUS

## 2023-01-15 MED ORDER — ACETAMINOPHEN 500 MG PO TABS
ORAL_TABLET | ORAL | Status: AC
  Administered 2023-01-15: 1000 mg via ORAL
  Filled 2023-01-15: qty 2

## 2023-01-15 MED ORDER — HEMOSTATIC AGENTS (NO CHARGE) OPTIME
TOPICAL | Status: DC | PRN
Start: 2023-01-15 — End: 2023-01-15
  Administered 2023-01-15: 1 via TOPICAL

## 2023-01-15 MED ORDER — MIDAZOLAM HCL 2 MG/2ML IJ SOLN
INTRAMUSCULAR | Status: DC | PRN
  Administered 2023-01-15: 2 mg via INTRAVENOUS

## 2023-01-15 MED ORDER — OXYCODONE HCL 5 MG/5ML PO SOLN: 5.0000 mg | Freq: Once | ORAL | Status: DC | PRN

## 2023-01-15 MED ORDER — DEXMEDETOMIDINE HCL IN NACL 80 MCG/20ML IV SOLN
INTRAVENOUS | Status: DC | PRN
Start: 2023-01-15 — End: 2023-01-16
  Administered 2023-01-15: 4 ug via INTRAVENOUS
  Administered 2023-01-15: 8 ug via INTRAVENOUS

## 2023-01-15 MED ORDER — PROPOFOL 10 MG/ML IV BOLUS
INTRAVENOUS | Status: DC | PRN
  Administered 2023-01-15: 150 ug/kg/min via INTRAVENOUS
  Administered 2023-01-15: 200 mg via INTRAVENOUS

## 2023-01-15 MED ORDER — LIDOCAINE 2% (20 MG/ML) 5 ML SYRINGE
INTRAMUSCULAR | Status: DC | PRN
  Administered 2023-01-15: 60 mg via INTRAVENOUS

## 2023-01-15 MED ORDER — INDOCYANINE GREEN 25 MG IV SOLR
2.5000 mg | Freq: Once | INTRAVENOUS | Status: AC
  Administered 2023-01-15: 2.5 mg via INTRAVENOUS
  Filled 2023-01-15: qty 1

## 2023-01-15 MED ORDER — ORAL CARE MOUTH RINSE: 15.0000 mL | Freq: Once | OROMUCOSAL | Status: AC

## 2023-01-15 MED ORDER — CHLORHEXIDINE GLUCONATE 0.12 % MT SOLN
OROMUCOSAL | Status: AC
  Administered 2023-01-15: 15 mL via OROMUCOSAL
  Filled 2023-01-15: qty 15

## 2023-01-15 MED ORDER — GADOBUTROL 1 MMOL/ML IV SOLN
9.0000 mL | Freq: Once | INTRAVENOUS | Status: AC | PRN
  Administered 2023-01-15: 9 mL via INTRAVENOUS

## 2023-01-15 MED ORDER — SODIUM CHLORIDE 0.9 % IR SOLN
Status: DC | PRN
  Administered 2023-01-15: 1000 mL

## 2023-01-15 MED ORDER — BUPIVACAINE-EPINEPHRINE 0.25% -1:200000 IJ SOLN
INTRAMUSCULAR | Status: DC | PRN
  Administered 2023-01-15: 20 mL

## 2023-01-15 MED ORDER — FENTANYL CITRATE (PF) 100 MCG/2ML IJ SOLN
25.0000 ug | INTRAMUSCULAR | Status: DC | PRN
  Administered 2023-01-15: 25 ug via INTRAVENOUS

## 2023-01-15 MED ORDER — ONDANSETRON HCL 4 MG/2ML IJ SOLN
INTRAMUSCULAR | Status: DC | PRN
  Administered 2023-01-15: 4 mg via INTRAVENOUS

## 2023-01-15 MED ORDER — FENTANYL CITRATE (PF) 100 MCG/2ML IJ SOLN
INTRAMUSCULAR | Status: AC
  Filled 2023-01-15: qty 2

## 2023-01-15 MED ORDER — CHLORHEXIDINE GLUCONATE 0.12 % MT SOLN: 15.0000 mL | Freq: Once | OROMUCOSAL | Status: AC

## 2023-01-15 MED ORDER — PROPOFOL 10 MG/ML IV BOLUS
INTRAVENOUS | Status: AC
  Filled 2023-01-15: qty 20

## 2023-01-15 MED ORDER — OXYCODONE HCL 5 MG PO TABS: 5.0000 mg | ORAL_TABLET | Freq: Once | ORAL | Status: DC | PRN

## 2023-01-15 MED ORDER — ACETAMINOPHEN 500 MG PO TABS: 1000.0000 mg | ORAL_TABLET | Freq: Once | ORAL | Status: AC

## 2023-01-15 MED ORDER — DEXAMETHASONE SODIUM PHOSPHATE 10 MG/ML IJ SOLN
INTRAMUSCULAR | Status: DC | PRN
  Administered 2023-01-15: 10 mg via INTRAVENOUS

## 2023-01-15 MED ORDER — AMISULPRIDE (ANTIEMETIC) 5 MG/2ML IV SOLN
10.0000 mg | Freq: Once | INTRAVENOUS | Status: AC
  Administered 2023-01-15: 10 mg via INTRAVENOUS

## 2023-01-15 MED ORDER — LACTATED RINGERS IV SOLN: INTRAVENOUS | Status: DC

## 2023-01-15 MED ORDER — SUGAMMADEX SODIUM 200 MG/2ML IV SOLN
INTRAVENOUS | Status: DC | PRN
  Administered 2023-01-15: 200 mg via INTRAVENOUS

## 2023-01-15 MED ORDER — MIDAZOLAM HCL 2 MG/2ML IJ SOLN
INTRAMUSCULAR | Status: AC
  Filled 2023-01-15: qty 2

## 2023-01-15 MED ORDER — FENTANYL CITRATE (PF) 250 MCG/5ML IJ SOLN
INTRAMUSCULAR | Status: AC
  Filled 2023-01-15: qty 5

## 2023-01-15 MED ORDER — CELECOXIB 200 MG PO CAPS
200.0000 mg | ORAL_CAPSULE | Freq: Once | ORAL | Status: AC
  Administered 2023-01-15: 200 mg via ORAL
  Filled 2023-01-15: qty 1

## 2023-01-15 MED ORDER — INDOCYANINE GREEN 25 MG IV SOLR
INTRAVENOUS | Status: DC | PRN
  Administered 2023-01-15: 2.5 mg via INTRAVENOUS

## 2023-01-15 MED ORDER — AMISULPRIDE (ANTIEMETIC) 5 MG/2ML IV SOLN
INTRAVENOUS | Status: AC
  Filled 2023-01-15: qty 4

## 2023-01-15 MED ORDER — BUPIVACAINE-EPINEPHRINE (PF) 0.25% -1:200000 IJ SOLN
INTRAMUSCULAR | Status: AC
  Filled 2023-01-15: qty 30

## 2023-01-15 MED ORDER — 0.9 % SODIUM CHLORIDE (POUR BTL) OPTIME
TOPICAL | Status: DC | PRN
  Administered 2023-01-15: 1000 mL

## 2023-01-15 MED ORDER — ROCURONIUM BROMIDE 10 MG/ML (PF) SYRINGE
PREFILLED_SYRINGE | INTRAVENOUS | Status: DC | PRN
  Administered 2023-01-15: 10 mg via INTRAVENOUS
  Administered 2023-01-15: 30 mg via INTRAVENOUS
  Administered 2023-01-15: 50 mg via INTRAVENOUS
  Administered 2023-01-15: 10 mg via INTRAVENOUS

## 2023-01-15 SURGICAL SUPPLY — 45 items
ADH SKN CLS APL DERMABOND .7 (GAUZE/BANDAGES/DRESSINGS) ×1
APL PRP STRL LF DISP 70% ISPRP (MISCELLANEOUS) ×1
APPLIER CLIP 5 13 M/L LIGAMAX5 (MISCELLANEOUS) ×1
APR CLP MED LRG 5 ANG JAW (MISCELLANEOUS) ×1
BAG COUNTER SPONGE SURGICOUNT (BAG) ×1 IMPLANT
BAG SPNG CNTER NS LX DISP (BAG) ×1
BLADE CLIPPER SURG (BLADE) IMPLANT
CANISTER SUCT 3000ML PPV (MISCELLANEOUS) ×1 IMPLANT
CHLORAPREP W/TINT 26 (MISCELLANEOUS) ×1 IMPLANT
CLIP APPLIE 5 13 M/L LIGAMAX5 (MISCELLANEOUS) ×1 IMPLANT
COVER MAYO STAND STRL (DRAPES) ×1 IMPLANT
COVER SURGICAL LIGHT HANDLE (MISCELLANEOUS) ×1 IMPLANT
DERMABOND ADVANCED .7 DNX12 (GAUZE/BANDAGES/DRESSINGS) ×1 IMPLANT
DISSECTOR BLUNT TIP ENDO 5MM (MISCELLANEOUS) IMPLANT
DRAPE C-ARM 42X120 X-RAY (DRAPES) ×1 IMPLANT
ELECT REM PT RETURN 9FT ADLT (ELECTROSURGICAL) ×1
ELECTRODE REM PT RTRN 9FT ADLT (ELECTROSURGICAL) ×1 IMPLANT
GLOVE BIO SURGEON STRL SZ7.5 (GLOVE) ×1 IMPLANT
GLOVE INDICATOR 8.0 STRL GRN (GLOVE) ×1 IMPLANT
GOWN STRL REUS W/ TWL LRG LVL3 (GOWN DISPOSABLE) ×2 IMPLANT
GOWN STRL REUS W/ TWL XL LVL3 (GOWN DISPOSABLE) ×1 IMPLANT
GOWN STRL REUS W/TWL LRG LVL3 (GOWN DISPOSABLE) ×2
GOWN STRL REUS W/TWL XL LVL3 (GOWN DISPOSABLE) ×1
HEMOSTAT SNOW SURGICEL 2X4 (HEMOSTASIS) IMPLANT
IRRIG SUCT STRYKERFLOW 2 WTIP (MISCELLANEOUS) ×1
IRRIGATION SUCT STRKRFLW 2 WTP (MISCELLANEOUS) ×1 IMPLANT
KIT BASIN OR (CUSTOM PROCEDURE TRAY) ×1 IMPLANT
KIT IMAGING PINPOINTPAQ (MISCELLANEOUS) IMPLANT
KIT TURNOVER KIT B (KITS) ×1 IMPLANT
NS IRRIG 1000ML POUR BTL (IV SOLUTION) ×1 IMPLANT
PAD ARMBOARD 7.5X6 YLW CONV (MISCELLANEOUS) ×1 IMPLANT
PENCIL BUTTON HOLSTER BLD 10FT (ELECTRODE) ×1 IMPLANT
SCISSORS LAP 5X35 DISP (ENDOMECHANICALS) ×1 IMPLANT
SET CHOLANGIOGRAPH 5 50 .035 (SET/KITS/TRAYS/PACK) ×1 IMPLANT
SET TUBE SMOKE EVAC HIGH FLOW (TUBING) ×1 IMPLANT
SUT MNCRL AB 4-0 PS2 18 (SUTURE) ×1 IMPLANT
SYS BAG RETRIEVAL 10MM (BASKET) ×1
SYSTEM BAG RETRIEVAL 10MM (BASKET) IMPLANT
TOWEL GREEN STERILE (TOWEL DISPOSABLE) ×1 IMPLANT
TOWEL GREEN STERILE FF (TOWEL DISPOSABLE) ×1 IMPLANT
TRAY LAPAROSCOPIC MC (CUSTOM PROCEDURE TRAY) ×1 IMPLANT
TROCAR ADV FIXATION 5X100MM (TROCAR) ×3 IMPLANT
TROCAR BALLN 12MMX100 BLUNT (TROCAR) ×1 IMPLANT
WARMER LAPAROSCOPE (MISCELLANEOUS) ×1 IMPLANT
WATER STERILE IRR 1000ML POUR (IV SOLUTION) ×1 IMPLANT

## 2023-01-15 NOTE — Progress Notes (Signed)
Pt arrived back to 6 north room 3 alert and oriented x4. Pain level 5/10. 3 port sites with skin glue all clean, dry, intact. Bed in lowest position. Husband at bedside. Will continue to monitor pt.

## 2023-01-15 NOTE — H&P (Signed)
Subjective No acute events. Feeling well. Abdominal pain has almost completely resolved. No n/v.  Objective: Vital signs in last 24 hours: Temp:  [98.3 F (36.8 C)-100.9 F (38.3 C)] 98.8 F (37.1 C) (10/01 0836) Pulse Rate:  [63-100] 63 (10/01 0836) Resp:  [16-20] 17 (10/01 0836) BP: (107-130)/(68-86) 120/74 (10/01 0836) SpO2:  [96 %-100 %] 98 % (10/01 0836) Weight:  [92.5 kg] 92.5 kg (10/01 0836)    Intake/Output from previous day: No intake/output data recorded. Intake/Output this shift: No intake/output data recorded.  Gen: NAD, comfortable CV: RRR Pulm: Normal work of breathing Abd: Soft, NT/ND Ext: SCDs in place  Lab Results: CBC  Recent Labs    01/14/23 1647 01/15/23 0223  WBC 10.1 7.1  HGB 12.4 11.5*  HCT 36.7 33.8*  PLT 330 282   BMET Recent Labs    01/14/23 1647 01/15/23 0223  NA 138 136  K 3.4* 3.3*  CL 105 104  CO2 23 24  GLUCOSE 121* 107*  BUN 6 <5*  CREATININE 0.79 0.82  CALCIUM 8.7* 8.1*   PT/INR Recent Labs    01/15/23 0223  LABPROT 17.0*  INR 1.4*   ABG No results for input(s): "PHART", "HCO3" in the last 72 hours.  Invalid input(s): "PCO2", "PO2"  Studies/Results:  Anti-infectives: Anti-infectives (From admission, onward)    Start     Dose/Rate Route Frequency Ordered Stop   01/14/23 2200  [MAR Hold]  piperacillin-tazobactam (ZOSYN) IVPB 3.375 g        (MAR Hold since Tue 01/15/2023 at 0822.Hold Reason: Transfer to a Procedural area)   3.375 g 12.5 mL/hr over 240 Minutes Intravenous Every 8 hours 01/14/23 1613 01/21/23 2159   01/14/23 1545  piperacillin-tazobactam (ZOSYN) IVPB 3.375 g        3.375 g 100 mL/hr over 30 Minutes Intravenous  Once 01/14/23 1533 01/14/23 1745        Assessment/Plan: Patient Active Problem List   Diagnosis Date Noted   Acute cholecystitis 01/14/2023   Weight loss 05/01/2021   Elevated fasting glucose 04/10/2021   Body mass index (BMI) of 40.1-44.9 in adult [facility]) 02/08/2021   46yoF  with likely acute cholecystitis, possible recent choledocholithiasis  Hepatits panel negative LFTs improving, bilirubin down to 2.5 from 2.7 Plt normal  -The anatomy and physiology of the hepatobiliary system was discussed with her again today. The pathophysiology of gallbladder disease was then reviewed as well. -The options for treatment were discussed including ongoing observation which may result in subsequent gallbladder complications (infection, pancreatitis, choledocholithiasis, etc), drainage procedures, and surgery - laparoscopic cholecystectomy with ICG cholangiography and possible cholangiogram -The planned procedure, material risks (including, but not limited to, pain, bleeding, infection, scarring, need for blood transfusion, damage to surrounding structures- blood vessels/nerves/viscus/organs, damage to bile duct, bile leak, chronic diarrhea, conversion to a 'subtotal' cholecystectomy and general expectations therein, post-cholecystectomy diarrhea, potential need for additional procedures including EGD/ERCP, hernia, worsening of pre-existing medical conditions, pancreatitis, pneumonia, heart attack, stroke, death) benefits and alternatives to surgery were discussed at length. We have noted a good probability that the procedure would help improve their symptoms. The patient's questions were answered to her and her husband's satisfaction, they voiced understanding and elected to proceed with surgery. Additionally, we discussed typical postoperative expectations and the recovery process.   LOS: 0 days   I spent a total of 35 minutes in both face-to-face and non-face-to-face activities, excluding procedures performed, for this visit on the date of this encounter. ROUNDS 81191 - 25 minutes met  or exceeded / Straightforward or Low MDM 16109 - 35 minutes met or exceeded / Moderate MDM 60454 - 50 minutes met or exceeded / High MDM  Check [email or web address] for General Surgery coverage  night/weekend/holidays  No secure chat available for me given surgeries/clinic/off post call which would lead to a delay in care.    Marin Olp, MD [facility], A [facility]

## 2023-01-15 NOTE — Anesthesia Procedure Notes (Signed)
Procedure Name: Intubation Date/Time: 01/15/2023 10:00 AM  Performed by: Kayleen Memos, CRNAPre-anesthesia Checklist: Patient identified, Emergency Drugs available, Suction available and Patient being monitored Patient Re-evaluated:Patient Re-evaluated prior to induction Oxygen Delivery Method: Circle System Utilized Preoxygenation: Pre-oxygenation with 100% oxygen Induction Type: IV induction Ventilation: Mask ventilation without difficulty Laryngoscope Size: Mac and 3 Grade View: Grade I Tube type: Oral Tube size: 7.5 mm Number of attempts: 1 Airway Equipment and Method: Stylet Placement Confirmation: ETT inserted through vocal cords under direct vision, positive ETCO2 and breath sounds checked- equal and bilateral Secured at: 21 (at teeth) cm Tube secured with: Tape Dental Injury: Teeth and Oropharynx as per pre-operative assessment

## 2023-01-15 NOTE — Discharge Instructions (Signed)

## 2023-01-15 NOTE — Progress Notes (Signed)
Report called to short stay. Nurse ginger verbalized understanding of report and had no further questions. CHG wipes completed. All undergarments removed. Patient voided and brushed teeth. No order in currently for surgery so consent will be signed in short stay. Short stay Nurse made aware.

## 2023-01-15 NOTE — Anesthesia Preprocedure Evaluation (Addendum)
Anesthesia Evaluation  Patient identified by MRN, date of birth, ID band Patient awake    Reviewed: Allergy & Precautions, NPO status , Patient's Chart, lab work & pertinent test results  History of Anesthesia Complications Negative for: history of anesthetic complications  Airway Mallampati: II  TM Distance: >3 FB Neck ROM: Full    Dental  (+) Dental Advisory Given, Teeth Intact   Pulmonary former smoker   Pulmonary exam normal        Cardiovascular negative cardio ROS Normal cardiovascular exam     Neuro/Psych negative neurological ROS  negative psych ROS   GI/Hepatic negative GI ROS, Neg liver ROS,,,  Endo/Other   K 3.3 Obesity   Renal/GU negative Renal ROS     Musculoskeletal negative musculoskeletal ROS (+)    Abdominal   Peds  Hematology  (+) Blood dyscrasia, anemia   Anesthesia Other Findings   Reproductive/Obstetrics                             Anesthesia Physical Anesthesia Plan  ASA: 2  Anesthesia Plan: General   Post-op Pain Management: Tylenol PO (pre-op)* and Celebrex PO (pre-op)*   Induction: Intravenous  PONV Risk Score and Plan: 3 and Treatment may vary due to age or medical condition, Ondansetron, Dexamethasone and Midazolam  Airway Management Planned: Oral ETT  Additional Equipment: None  Intra-op Plan:   Post-operative Plan: Extubation in OR  Informed Consent: I have reviewed the patients History and Physical, chart, labs and discussed the procedure including the risks, benefits and alternatives for the proposed anesthesia with the patient or authorized representative who has indicated his/her understanding and acceptance.     Dental advisory given  Plan Discussed with: CRNA and Anesthesiologist  Anesthesia Plan Comments:        Anesthesia Quick Evaluation

## 2023-01-15 NOTE — Progress Notes (Signed)
   01/15/23 1535  TOC Brief Assessment  Insurance and Status Reviewed  Patient has primary care physician Yes  Home environment has been reviewed yes  Prior level of function: independent  Prior/Current Home Services No current home services  Social Determinants of Health Reivew SDOH reviewed no interventions necessary  Readmission risk has been reviewed Yes  Transition of care needs no transition of care needs at this time     [facility] Team will continue to follow for discharge needs

## 2023-01-15 NOTE — Op Note (Signed)
01/15/2023 11:34 AM  PATIENT: Betty Sellers  47 y.o. female  Patient Care Team: Carlean Jews, NP as PCP - General (Family Medicine)  PRE-OPERATIVE DIAGNOSIS: Acute cholecystitis, possible choledocholithiasis  POST-OPERATIVE DIAGNOSIS: Same  PROCEDURE: Laparoscopic cholecystectomy with indocyanine green cholangiography  SURGEON: Marin Olp, MD  ASSISTANT: Rockwell Germany RNFA  ANESTHESIA: General endotracheal  EBL: 20 mL  DRAINS: None  SPECIMEN: Gallbladder  COUNTS: Sponge, needle and instrument counts were reported correct x2 at the conclusion of the operation  DISPOSITION: PACU in satisfactory condition  COMPLICATIONS: none  FINDINGS: Inflamed gallbladder consistent with acute cholecystitis.  Critical view of safety achieved prior to clipping or dividing structures.  ICG cholangiography demonstrates avid uptake by the liver and excretion into the biliary system.  Tracer seen through all duodenum consistent with at least partial patency of the bile duct.  Attempted intraoperative cholangiogram however the cholangiocatheter however was not able to be flushed as it appears that she has debris and stones within the cystic duct that could not be milked back.  DESCRIPTION:   The patient was identified & brought into the operating room.  She was then positioned supine on the OR table. SCDs were in place and active during the entire case.  She then underwent general endotracheal anesthesia. Pressure points were padded. Hair on the abdomen was clipped by the OR team. The abdomen was prepped and draped in the standard sterile fashion. Antibiotics were administered. A surgical timeout was performed and confirmed our plan.   A periumbilical incision was made. The umbilical stalk was grasped and retracted outwardly. The supraumbilical fascia was identified and incised. The peritoneal cavity was gently entered bluntly. A purse-string 0 Vicryl suture was placed. The Hasson cannula  was inserted into the peritoneal cavity and insufflation with CO2 commenced to . A laparoscope was inserted into the peritoneal cavity and inspection confirmed no evidence of trocar site complications. The patient was then positioned in reverse Trendelenburg with slight left side down. 3 additional 5mm trocars were placed along the right subcostal line - one 5mm port in mid subcostal region, another 5mm port in the right flank near the anterior axillary line, and a third 5mm port in the left subxiphoid region obliquely near the falciform ligament.  The liver and gallbladder were inspected.  The liver is normal in appearance.  The gallbladder is edematous and inflamed in appearance.  This is felt to be consistent with acute cholecystitis.. The gallbladder fundus was grasped and elevated cephalad. An additional grasper was then placed on the infundibulum of the gallbladder and the infundibulum was retracted laterally. Staying high on the gallbladder, the peritoneum on both sides of the gallbladder was opened with hook cautery. Gentle blunt dissection was then employed with a Art gallery manager working down into [employer]. The cystic duct was identified and carefully circumferentially dissected. The cystic artery was also identified and carefully circumferentially dissected. The space between the cystic artery and hepatocystic plate was developed such that a good view of the liver could be seen through a window medial to the cystic artery. The triangle of Calot had been cleared of all fibrofatty tissue. At this point, a critical view of safety was achieved and the only structures visualized was the skeletonized cystic duct laterally, the skeletonized cystic artery and the liver through the window medial to the artery.  A diminutive no posterior cystic artery was noted and ultimately controlled with 2 clips but done high upon the gallbladder halfway to the dome.  Under near-infrared  cholangiography,  there is uptake of tracer seen in the liver and excretion into the biliary system.  The common bile duct is visible through a very thin hepatoduodenal ligament.  There is also tracer activity seen to the wall of the duodenum which suggests at least patency of the biliary system.  At this point attention was turned to performing a cholangiogram. A partial cystic duct-otomy was created. A 55F cholangiocatheter was then introduced through a puncture site at the right subcostal ridge of the abdominal wall and directed it into the cystic duct. This was then secured with a clip.  Injectable saline was then instilled through the cholangiocatheter and were unable to get the catheter to flush.  We subsequently remove the clip and used a laparoscopic right angle clamp and gently occluded.  With this maneuver, we are able to occlude the cystic duct but again are unable to get the catheter to flush.  The catheter was removed and the cystic duct was milked retrograde.  There was debris noted to be coming back but there is no clearly palpable blockage or obstruction within the cystic duct that could be milked back.  We attempted again to pass the cholangiocatheter, and a clip was gently applied to occlude it, we are unable to again get this to flush.  This was felt to be consistent with a obstruction at least of her cystic duct.  The cholangiocatheter was therefore removed.  We did note that there was ICG contrast seen through the duodenum which suggest at least partial patency of her biliary system.   The cystic duct and artery were clipped with 2 clips on the patient side and 1 clip on the specimen side. The cystic duct and artery were then divided. The gallbladder was then freed from its remaining attachments to the liver using electrocautery and placed into an endocatch bag.  As noted above, there was a diminutive posterior cystic artery that we did identify and traced this all the way up onto the gallbladder posteriorly.   At approximately midportion of the gallbladder this was controlled with 2 clips on the patient's side.    The RUQ was gently irrigated with sterile saline. Hemostasis was then verified. The clips were in good position; the gallbladder fossa was dry. Surgicel snow was placed in the gallbladder fossa. The rest of the abdomen was inspected no injury nor bleeding elsewhere was identified.  The endocatch bag containing the gallbladder was then removed from the umbilical port site and passed off as specimen. The RUQ ports were removed under direct visualization and noted to be hemostatic. The umbilical fascia was then closed using the 0 Vicryl purse-string suture. The fascia was palpated and noted to be completely closed. The skin of all incision sites was approximated with 4-0 monocryl subcuticular suture and dermabond applied. She was then awakened from anesthesia, extubated, and transferred to a stretcher for transport to PACU in satisfactory condition.

## 2023-01-15 NOTE — Progress Notes (Signed)
Went in room to do PCR and transport had taken pt already. Nurse Ginger in short stay made aware and stated they would complete PCR down there

## 2023-01-16 ENCOUNTER — Encounter (HOSPITAL_COMMUNITY): Payer: Self-pay | Admitting: Surgery

## 2023-01-16 DIAGNOSIS — K8065 Calculus of gallbladder and bile duct with chronic cholecystitis with obstruction: Secondary | ICD-10-CM | POA: Diagnosis present

## 2023-01-16 DIAGNOSIS — Z6835 Body mass index (BMI) 35.0-35.9, adult: Secondary | ICD-10-CM | POA: Diagnosis not present

## 2023-01-16 DIAGNOSIS — Y733 Surgical instruments, materials and gastroenterology and urology devices (including sutures) associated with adverse incidents: Secondary | ICD-10-CM | POA: Diagnosis present

## 2023-01-16 DIAGNOSIS — Z833 Family history of diabetes mellitus: Secondary | ICD-10-CM | POA: Diagnosis not present

## 2023-01-16 DIAGNOSIS — K8050 Calculus of bile duct without cholangitis or cholecystitis without obstruction: Secondary | ICD-10-CM | POA: Diagnosis not present

## 2023-01-16 DIAGNOSIS — R748 Abnormal levels of other serum enzymes: Secondary | ICD-10-CM

## 2023-01-16 DIAGNOSIS — E669 Obesity, unspecified: Secondary | ICD-10-CM | POA: Diagnosis present

## 2023-01-16 DIAGNOSIS — R1011 Right upper quadrant pain: Secondary | ICD-10-CM | POA: Diagnosis present

## 2023-01-16 DIAGNOSIS — K810 Acute cholecystitis: Secondary | ICD-10-CM | POA: Diagnosis not present

## 2023-01-16 DIAGNOSIS — K5909 Other constipation: Secondary | ICD-10-CM | POA: Diagnosis present

## 2023-01-16 DIAGNOSIS — Z87891 Personal history of nicotine dependence: Secondary | ICD-10-CM | POA: Diagnosis not present

## 2023-01-16 DIAGNOSIS — Y838 Other surgical procedures as the cause of abnormal reaction of the patient, or of later complication, without mention of misadventure at the time of the procedure: Secondary | ICD-10-CM | POA: Diagnosis present

## 2023-01-16 DIAGNOSIS — Z8249 Family history of ischemic heart disease and other diseases of the circulatory system: Secondary | ICD-10-CM | POA: Diagnosis not present

## 2023-01-16 LAB — COMPREHENSIVE METABOLIC PANEL
ALT: 570 U/L — ABNORMAL HIGH (ref 0–44)
AST: 306 U/L — ABNORMAL HIGH (ref 15–41)
Albumin: 3 g/dL — ABNORMAL LOW (ref 3.5–5.0)
Alkaline Phosphatase: 171 U/L — ABNORMAL HIGH (ref 38–126)
Anion gap: 8 (ref 5–15)
BUN: 5 mg/dL — ABNORMAL LOW (ref 6–20)
CO2: 25 mmol/L (ref 22–32)
Calcium: 8.4 mg/dL — ABNORMAL LOW (ref 8.9–10.3)
Chloride: 103 mmol/L (ref 98–111)
Creatinine, Ser: 0.82 mg/dL (ref 0.44–1.00)
GFR, Estimated: >60 mL/min (ref >60)
Glucose, Bld: 132 mg/dL — ABNORMAL HIGH (ref 70–99)
Potassium: 4.2 mmol/L (ref 3.5–5.1)
Sodium: 136 mmol/L (ref 135–145)
Total Bilirubin: 2.4 mg/dL — ABNORMAL HIGH (ref 0.3–1.2)
Total Protein: 6.3 g/dL — ABNORMAL LOW (ref 6.5–8.1)

## 2023-01-16 MED ORDER — ACETAMINOPHEN 500 MG PO TABS
1000.0000 mg | ORAL_TABLET | Freq: Four times a day (QID) | ORAL | Status: DC
  Administered 2023-01-16 – 2023-01-20 (×11): 1000 mg via ORAL
  Filled 2023-01-16 (×12): qty 2

## 2023-01-16 MED ORDER — OXYCODONE HCL 5 MG PO TABS
5.0000 mg | ORAL_TABLET | ORAL | Status: DC | PRN
  Administered 2023-01-17: 10 mg via ORAL
  Administered 2023-01-17 – 2023-01-19 (×2): 5 mg via ORAL
  Administered 2023-01-19: 10 mg via ORAL
  Filled 2023-01-16: qty 2
  Filled 2023-01-16 (×2): qty 1
  Filled 2023-01-16 (×2): qty 2

## 2023-01-16 MED ORDER — HYDROMORPHONE HCL 1 MG/ML IJ SOLN
0.5000 mg | INTRAMUSCULAR | Status: DC | PRN
  Administered 2023-01-16 – 2023-01-18 (×4): 0.5 mg via INTRAVENOUS
  Filled 2023-01-16 (×4): qty 0.5

## 2023-01-16 MED ORDER — POLYETHYLENE GLYCOL 3350 17 G PO PACK: 17.0000 g | PACK | Freq: Every day | ORAL | Status: DC

## 2023-01-16 NOTE — Consult Note (Signed)
Consultation Note   Referring Provider:  General Surgery PCP: Carlean Jews, NP Primary Gastroenterologist: Gentry Fitz        Reason for Consultation: Bile duct stone  DOA: 01/14/2023         Hospital Day: 3   ASSESSMENT    Brief Narrative:  47 y.o. year old female with cholelithiasis,  status post laparoscopic cholecystectomy this admission  Elevated liver tests, mixed pattern / post-op MRCP demonstrating choledocholithiasis ( IOC was attempted but not able to be done)  Chronic constipation managed with daily Miralax   Colon cancer screening. She will be 47 yo in a few weeks.      PLAN:   -Will need ERCP with stone extraction. The benefits and risks of ERCP with possible sphincterotomy not limited to cardiopulmonary complications of sedation, bleeding, infection, perforation,and pancreatitis were discussed with the patient who agrees to proceed.   --Will need to coordinate timing of procedure with Endoscopy Department, Anesthesia and Endoscopist. Right now there doesn't seem to be any availability for tomorrow.  --Okay for a diet from GI standpoint.   HPI   Patient admitted 9/30 after presenting to the ED with upper abdominal pain radiating through to her back with nausea and vomiting. Pain had started and been intermittent for several days. Initially episodes would occur a few hours after eating but then woke her up from sleep. She has no chronic Gi issues other than constipation managed with daily miralax  Patient was febrile on admission.  Alkaline phosphatase was 185, AST 447, ALT 383 and total bilirubin 2.5 . Ultrasound showed gallstones with sludge and borderline gallbladder wall thickening.  CBD was 7 mm.  On 01/15/2023 she underwent a laparoscopic cholecystectomy for acute cholecystitis. IOC attempted but cholangiocatheter was not able to be flushed as it appeared  she had debris and stones within the cystic duct that could  not be milked back . Liver tests remained elevated,  MRCP done and showed diffuse biliary ductal dilatation with common bile duct measuring 12 mm in diameter. Few calculi are seen in the distal common bile duct measuring up to 5 mm. Also confirmed she had acute cholecystitis.    Some improvement in liver enzymes overnight but bili and alk phos still elevated. WBC normal.    Previous GI Evaluations   none  Labs and Imaging: Recent Labs    01/14/23 1042 01/14/23 1647 01/15/23 0223  WBC 10.3 10.1 7.1  HGB 12.8 12.4 11.5*  HCT 37.8 36.7 33.8*  PLT 335 330 282   Recent Labs    01/14/23 1647 01/15/23 0223 01/16/23 0549  NA 138 136 136  K 3.4* 3.3* 4.2  CL 105 104 103  CO2 23 24 25   GLUCOSE 121* 107* 132*  BUN 6 <5* 5*  CREATININE 0.79 0.82 0.82  CALCIUM 8.7* 8.1* 8.4*   Recent Labs    01/16/23 0549  PROT 6.3*  ALBUMIN 3.0*  AST 306*  ALT 570*  ALKPHOS 171*  BILITOT 2.4*   Recent Labs    01/14/23 1647  HEPBSAG NON REACTIVE  HCVAB NON REACTIVE  HEPAIGM NON REACTIVE  HEPBIGM NON REACTIVE   Recent Labs    01/15/23 0223  LABPROT 17.0*  INR 1.4*  History reviewed. No pertinent past medical history.  Past Surgical History:  Procedure Laterality Date   CESAREAN SECTION  2010   CESAREAN SECTION  2012   CHOLECYSTECTOMY N/A 01/15/2023   Procedure: LAPAROSCOPIC CHOLECYSTECTOMY WITH ICG;  Surgeon: Andria Meuse, MD;  Location: [facility] OR;  Service: General;  Laterality: N/A;    Family History  Problem Relation Age of Onset   Cancer Father    Heart failure Father    Diabetes Other     Prior to Admission medications   Not on File    Current Facility-Administered Medications  Medication Dose Route Frequency Provider Last Rate Last Admin   acetaminophen (TYLENOL) tablet 1,000 mg  1,000 mg Oral Q6H Maczis, Michael M, PA-C       dextrose 5 % and 0.45 % NaCl with KCl 20 mEq/L infusion   Intravenous Continuous Andria Meuse, MD 75 mL/hr at 01/16/23  0350 New Bag at 01/16/23 0350   diphenhydrAMINE (BENADRYL) capsule 25 mg  25 mg Oral Q6H PRN Andria Meuse, MD       Or   diphenhydrAMINE (BENADRYL) injection 25 mg  25 mg Intravenous Q6H PRN Andria Meuse, MD       enoxaparin (LOVENOX) injection 40 mg  40 mg Subcutaneous Q24H Andria Meuse, MD       HYDROmorphone (DILAUDID) injection 0.5 mg  0.5 mg Intravenous Q3H PRN Jacinto Halim, PA-C       metoprolol tartrate (LOPRESSOR) injection 5 mg  5 mg Intravenous Q6H PRN Andria Meuse, MD       ondansetron (ZOFRAN-ODT) disintegrating tablet 4 mg  4 mg Oral Q6H PRN Andria Meuse, MD       Or   ondansetron [facility]) injection 4 mg  4 mg Intravenous Q6H PRN Andria Meuse, MD   4 mg at 01/16/23 0518   oxyCODONE (Oxy IR/ROXICODONE) immediate release tablet 5-10 mg  5-10 mg Oral Q4H PRN Jacinto Halim, PA-C       piperacillin-tazobactam (ZOSYN) IVPB 3.375 g  3.375 g Intravenous Q8H Andria Meuse, MD 12.5 mL/hr at 01/16/23 0520 3.375 g at 01/16/23 0520   polyethylene glycol (MIRALAX / GLYCOLAX) packet 17 g  17 g Oral Daily Maczis, Elmer Sow, PA-C       senna (SENOKOT) tablet 8.6 mg  1 tablet Oral BID Andria Meuse, MD   8.6 mg at 01/15/23 2107   simethicone (MYLICON) chewable tablet 40 mg  40 mg Oral Q6H PRN Andria Meuse, MD   40 mg at 01/16/23 0518    Allergies as of 01/14/2023   (No Known Allergies)    Social History   Socioeconomic History   Marital status: Married    Spouse name: Not on file   Number of children: Not on file   Years of education: Not on file   Highest education level: Not on file  Occupational History   Not on file  Tobacco Use   Smoking status: Former    Current packs/day: 0.00    Types: Cigarettes    Quit date: 2009    Years since quitting: 15.7   Smokeless tobacco: Never  Vaping Use   Vaping status: Never Used  Substance and Sexual Activity   Alcohol use: Yes   Drug use: Never   Sexual  activity: Yes    Partners: Male  Other Topics Concern   Not on file  Social History Narrative   Not on file   Social  Determinants of Health   Financial Resource Strain: Not on file  Food Insecurity: Not on file  Transportation Needs: Not on file  Physical Activity: Not on file  Stress: Not on file  Social Connections: Not on file  Intimate Partner Violence: Not on file    Code Status   Code Status: Full Code  Review of Systems: All systems reviewed and negative except where noted in HPI.  Physical Exam: Vital signs in last 24 hours: Temp:  [98.2 F (36.8 C)-98.7 F (37.1 C)] 98.3 F (36.8 C) (10/02 1100) Pulse Rate:  [58-70] 63 (10/02 1100) Resp:  [18] 18 (10/02 1100) BP: (102-115)/(55-77) 110/63 (10/02 1100) SpO2:  [98 %-100 %] 100 % (10/02 1100)    General:  Pleasant female in NAD Psych:  Cooperative. Normal mood and affect Eyes: Pupils equal Ears:  Normal auditory acuity Nose: No deformity, discharge or lesions Neck:  Supple, no masses felt Lungs:  Clear to auscultation.  Heart:  Regular rate, regular rhythm.  Abdomen:  Soft, nondistended, expected post-op RUQ tenderness. Active bowel sounds, no masses felt Rectal :  Deferred Msk: Symmetrical without gross deformities.  Neurologic:  Alert, oriented, grossly normal neurologically Extremities : No edema Skin:  Intact without significant lesions.    Intake/Output from previous day: 10/01 0701 - 10/02 0700 In: 1439.3 [I.V.:1348.2; IV Piggyback:91.1] Out: 5 [Blood:5] Intake/Output this shift:  No intake/output data recorded.  Principal Problem:   Acute cholecystitis    Willette Cluster, NP-C   01/16/2023, 1:21 PM

## 2023-01-16 NOTE — Progress Notes (Signed)
1 Day Post-Op  Subjective: CC: Having RUQ/incisional pain that is worse with movement. Having intermittent nausea that does not seem to be related to po intake or pain medication. No vomiting. Also having burping/belching. Tolerated small amount of cld last night. NPO currently while awaiting results of MRI. No flatus or bm. Voiding. Mobilizing. No cp or sob.   Objective: Vital signs in last 24 hours: Temp:  [97.7 F (36.5 C)-99.4 F (37.4 C)] 98.2 F (36.8 C) (10/02 0753) Pulse Rate:  [54-72] 70 (10/02 0753) Resp:  [13-18] 18 (10/02 0753) BP: (102-122)/(55-78) 102/55 (10/02 0753) SpO2:  [96 %-100 %] 98 % (10/02 0753) Weight:  [92.5 kg] 92.5 kg (10/01 0836)    Intake/Output from previous day: 10/01 0701 - 10/02 0700 In: 1639.3 [I.V.:1548.2; IV Piggyback:91.1] Out: -  Intake/Output this shift: No intake/output data recorded.  PE: Gen:  Alert, NAD, pleasant Card:  RRR Pulm:  CTAB, no W/R/R, effort normal Abd: Soft, very mild distension, appropriately tender around laparoscopic incisions, no rigidity or guarding and otherwise NT, +BS. Incisions with glue intact appears well and are without drainage, bleeding, or signs of infection  Psych: A&Ox3   Lab Results:  Recent Labs    01/14/23 1647 01/15/23 0223  WBC 10.1 7.1  HGB 12.4 11.5*  HCT 36.7 33.8*  PLT 330 282   BMET Recent Labs    01/15/23 0223 01/16/23 0549  NA 136 136  K 3.3* 4.2  CL 104 103  CO2 24 25  GLUCOSE 107* 132*  BUN <5* 5*  CREATININE 0.82 0.82  CALCIUM 8.1* 8.4*   PT/INR Recent Labs    01/15/23 0223  LABPROT 17.0*  INR 1.4*   CMP     Component Value Date/Time   NA 136 01/16/2023 0549   NA 138 03/23/2021 0920   K 4.2 01/16/2023 0549   CL 103 01/16/2023 0549   CO2 25 01/16/2023 0549   GLUCOSE 132 (H) 01/16/2023 0549   BUN 5 (L) 01/16/2023 0549   BUN 9 03/23/2021 0920   CREATININE 0.82 01/16/2023 0549   CALCIUM 8.4 (L) 01/16/2023 0549   PROT 6.3 (L) 01/16/2023 0549   PROT 6.7  03/23/2021 0920   ALBUMIN 3.0 (L) 01/16/2023 0549   ALBUMIN 4.0 03/23/2021 0920   AST 306 (H) 01/16/2023 0549   ALT 570 (H) 01/16/2023 0549   ALKPHOS 171 (H) 01/16/2023 0549   BILITOT 2.4 (H) 01/16/2023 0549   BILITOT 0.3 03/23/2021 0920   GFRNONAA >60 01/16/2023 0549   Lipase     Component Value Date/Time   LIPASE 34 01/14/2023 1229    Studies/Results: US Abdomen Limited RUQ (LIVER/GB)  Result Date: 01/14/2023 CLINICAL DATA:  Right upper quadrant pain. EXAM: ULTRASOUND ABDOMEN LIMITED RIGHT UPPER QUADRANT COMPARISON:  None Available. FINDINGS: Gallbladder: Gallstones and gallbladder sludge are noted. The gallbladder wall is borderline thickened. There is a positive sonographic Murphy sign noted by sonographer. Common bile duct: Diameter: 7 mm Liver: No focal lesion identified. Within normal limits in parenchymal echogenicity. Portal vein is patent on color Doppler imaging with normal direction of blood flow towards the liver. Other: None. IMPRESSION: Cholelithiasis with borderline gallbladder wall thickening and a positive sonographic Murphy sign. Findings are suspicious for acute cholecystitis. Electronically Signed   By: Romona Curls M.D.   On: 01/14/2023 14:23    Anti-infectives: Anti-infectives (From admission, onward)    Start     Dose/Rate Route Frequency Ordered Stop   01/14/23 2200  piperacillin-tazobactam (ZOSYN) IVPB 3.375  g        3.375 g 12.5 mL/hr over 240 Minutes Intravenous Every 8 hours 01/14/23 1613 01/21/23 2159   01/14/23 1545  piperacillin-tazobactam (ZOSYN) IVPB 3.375 g        3.375 g 100 mL/hr over 30 Minutes Intravenous  Once 01/14/23 1533 01/14/23 1745        Assessment/Plan POD 1 s/p laparoscopic cholecystectomy by Dr. Cliffton Asters for Acute Cholecystitis on 01/15/23 - LFT's elevated pre-op. Unable to do IOC intra-op. LFT's remain elevated this AM with T. Bili of 2.4 (2.5) and Alk Phos 171 (167). MRCP pending to r/o Choledocholithiasis  - Cont abx. Will  discuss with MD duration. End date is currently 10/7 - Currently NPO. Will leave NPO until MRCP results. If positive for Choledocholithiasis will plan GI consult. If negative for Choledocholithiasis will restart CLD.  - Mobilize - Pulm toilet  FEN - NPO till MRCP results. IVF at 77ml/hr VTE - SCDs, Lovenox ID - Zosyn >>  Afebrile. No tachycardia or systolic hypotension.    LOS: 0 days    Jacinto Halim , [facility] 01/16/2023, 8:18 AM Please see Amion for pager number during day hours 7:00am-4:30pm

## 2023-01-16 NOTE — Transfer of Care (Signed)
Immediate Anesthesia Transfer of Care Note  Patient: Betty Sellers  Procedure(s) Performed: LAPAROSCOPIC CHOLECYSTECTOMY WITH ICG (Abdomen)  Patient Location: PACU  Anesthesia Type:General  Level of Consciousness: awake, alert , and oriented  Airway & Oxygen Therapy: Patient Spontanous Breathing  Post-op Assessment: Report given to RN, Post -op Vital signs reviewed and stable, and Patient moving all extremities  Post vital signs: Reviewed and stable  Last Vitals:  Vitals Value Taken Time  BP 110/63 01/16/23 1100  Temp 36.8 C 01/16/23 1100  Pulse 63 01/16/23 1100  Resp 18 01/16/23 1100  SpO2 100 % 01/16/23 1100    Last Pain:  Vitals:   01/16/23 1100  TempSrc: Oral  PainSc:          Complications: No notable events documented.

## 2023-01-16 NOTE — Plan of Care (Signed)
  Problem: Education: Goal: Knowledge of General Education information will improve Description: Including pain rating scale, medication(s)/side effects and non-pharmacologic comfort measures Outcome: Progressing   Problem: Activity: Goal: Risk for activity intolerance will decrease Outcome: Progressing   Problem: Elimination: Goal: Will not experience complications related to urinary retention Outcome: Progressing   Problem: Pain Managment: Goal: General experience of comfort will improve Outcome: Progressing   

## 2023-01-16 NOTE — Anesthesia Postprocedure Evaluation (Signed)
Anesthesia Post Note  Patient: Betty Sellers  Procedure(s) Performed: LAPAROSCOPIC CHOLECYSTECTOMY WITH ICG (Abdomen)     Patient location during evaluation: PACU Anesthesia Type: General Level of consciousness: awake and alert Pain management: pain level controlled Vital Signs Assessment: post-procedure vital signs reviewed and stable Respiratory status: spontaneous breathing, nonlabored ventilation and respiratory function stable Cardiovascular status: stable and blood pressure returned to baseline Anesthetic complications: no   No notable events documented.  Last Vitals:  Vitals:   01/16/23 1100 01/16/23 1444  BP: 110/63 110/66  Pulse: 63 72  Resp: 18 18  Temp: 36.8 C 37 C  SpO2: 100% 100%    Last Pain:  Vitals:   01/16/23 1519  TempSrc:   PainSc: 3                  Beryle Lathe

## 2023-01-16 NOTE — Progress Notes (Signed)
Per Casimiro Needle M,PA I  let the patient know they are leaving her npo as her mrcp showed choledocholithiasis and they are calling gi to come see her. Pt verbalized understanding.

## 2023-01-17 DIAGNOSIS — K810 Acute cholecystitis: Secondary | ICD-10-CM

## 2023-01-17 DIAGNOSIS — K8050 Calculus of bile duct without cholangitis or cholecystitis without obstruction: Secondary | ICD-10-CM

## 2023-01-17 LAB — COMPREHENSIVE METABOLIC PANEL
ALT: 573 U/L — ABNORMAL HIGH (ref 0–44)
AST: 251 U/L — ABNORMAL HIGH (ref 15–41)
Albumin: 3 g/dL — ABNORMAL LOW (ref 3.5–5.0)
Alkaline Phosphatase: 195 U/L — ABNORMAL HIGH (ref 38–126)
Anion gap: 8 (ref 5–15)
BUN: 6 mg/dL (ref 6–20)
CO2: 29 mmol/L (ref 22–32)
Calcium: 8.6 mg/dL — ABNORMAL LOW (ref 8.9–10.3)
Chloride: 102 mmol/L (ref 98–111)
Creatinine, Ser: 0.86 mg/dL (ref 0.44–1.00)
GFR, Estimated: >60 mL/min (ref >60)
Glucose, Bld: 103 mg/dL — ABNORMAL HIGH (ref 70–99)
Potassium: 4.9 mmol/L (ref 3.5–5.1)
Sodium: 139 mmol/L (ref 135–145)
Total Bilirubin: 2.3 mg/dL — ABNORMAL HIGH (ref 0.3–1.2)
Total Protein: 6.8 g/dL (ref 6.5–8.1)

## 2023-01-17 MED ORDER — POLYETHYLENE GLYCOL 3350 17 G PO PACK
17.0000 g | PACK | Freq: Two times a day (BID) | ORAL | Status: DC
  Administered 2023-01-17 – 2023-01-20 (×6): 17 g via ORAL
  Filled 2023-01-17 (×6): qty 1

## 2023-01-17 NOTE — Progress Notes (Signed)
2 Days Post-Op  Subjective: CC: Feeling better. Tolerating cld without any further nausea. No vomiting. Passing flatus. No bm. Her RUQ pain and pain around her incisions has also improved. Voiding. Mobilizing.   Objective: Vital signs in last 24 hours: Temp:  [98.3 F (36.8 C)-98.6 F (37 C)] 98.4 F (36.9 C) (10/03 0412) Pulse Rate:  [63-72] 68 (10/03 0412) Resp:  [18] 18 (10/03 0412) BP: (107-111)/(63-68) 107/64 (10/03 0412) SpO2:  [100 %] 100 % (10/03 0412) Last BM Date :  (PTA)  Intake/Output from previous day: 10/02 0701 - 10/03 0700 In: 2108.6 [P.O.:240; I.V.:1630.5; IV Piggyback:238.1] Out: -  Intake/Output this shift: No intake/output data recorded.  PE: Gen:  Alert, NAD, pleasant Card:  RRR Pulm:  CTAB, no W/R/R, effort normal Abd: Soft, ND, appropriately tender around laparoscopic incisions, no rigidity or guarding and otherwise NT, +BS. Incisions with glue intact appears well and are without drainage, bleeding, or signs of infection  Psych: A&Ox3   Lab Results:  Recent Labs    01/14/23 1647 01/15/23 0223  WBC 10.1 7.1  HGB 12.4 11.5*  HCT 36.7 33.8*  PLT 330 282   BMET Recent Labs    01/15/23 0223 01/16/23 0549  NA 136 136  K 3.3* 4.2  CL 104 103  CO2 24 25  GLUCOSE 107* 132*  BUN <5* 5*  CREATININE 0.82 0.82  CALCIUM 8.1* 8.4*   PT/INR Recent Labs    01/15/23 0223  LABPROT 17.0*  INR 1.4*   CMP     Component Value Date/Time   NA 136 01/16/2023 0549   NA 138 03/23/2021 0920   K 4.2 01/16/2023 0549   CL 103 01/16/2023 0549   CO2 25 01/16/2023 0549   GLUCOSE 132 (H) 01/16/2023 0549   BUN 5 (L) 01/16/2023 0549   BUN 9 03/23/2021 0920   CREATININE 0.82 01/16/2023 0549   CALCIUM 8.4 (L) 01/16/2023 0549   PROT 6.3 (L) 01/16/2023 0549   PROT 6.7 03/23/2021 0920   ALBUMIN 3.0 (L) 01/16/2023 0549   ALBUMIN 4.0 03/23/2021 0920   AST 306 (H) 01/16/2023 0549   ALT 570 (H) 01/16/2023 0549   ALKPHOS 171 (H) 01/16/2023 0549   BILITOT  2.4 (H) 01/16/2023 0549   BILITOT 0.3 03/23/2021 0920   GFRNONAA >60 01/16/2023 0549   Lipase     Component Value Date/Time   LIPASE 34 01/14/2023 1229    Studies/Results: MR 3D Recon At Scanner  Result Date: 01/16/2023 CLINICAL DATA:  Right upper quadrant pain. Cholelithiasis and biliary ductal dilatation on recent ultrasound. EXAM: MRI ABDOMEN WITHOUT AND WITH CONTRAST (INCLUDING MRCP) TECHNIQUE: Multiplanar multisequence MR imaging of the abdomen was performed both before and after the administration of intravenous contrast. Heavily T2-weighted images of the biliary and pancreatic ducts were obtained, and three-dimensional MRCP images were rendered by post processing. CONTRAST:  9mL GADAVIST GADOBUTROL 1 MMOL/ML IV SOLN COMPARISON:  Ultrasound on 01/14/2023 FINDINGS: Lower chest: No acute findings. Hepatobiliary: No hepatic masses identified. Gallstones are better demonstrated on recent ultrasound. Gallbladder is nondistended, however, there is diffuse gallbladder wall thickening pericholecystic edema, consistent with acute cholecystitis. Diffuse biliary ductal dilatation is seen, with common bile duct measuring 12 mm in diameter. Few calculi are seen in the distal common bile duct measuring up to 5 mm. Pancreas: No mass or inflammatory changes. No evidence of pancreatic ductal dilatation. Spleen:  Within normal limits in size and appearance. Adrenals/Urinary Tract: No suspicious masses identified. No evidence of hydronephrosis. Stomach/Bowel:  Unremarkable. Vascular/Lymphatic: No pathologically enlarged lymph nodes identified. No acute vascular findings. Other:  None. Musculoskeletal:  No suspicious bone lesions identified. IMPRESSION: Acute cholecystitis. Diffuse biliary ductal dilatation due to choledocholithiasis. Electronically Signed   By: Danae Orleans M.D.   On: 01/16/2023 10:16   MR ABDOMEN MRCP W WO CONTAST  Result Date: 01/16/2023 CLINICAL DATA:  Right upper quadrant pain. Cholelithiasis  and biliary ductal dilatation on recent ultrasound. EXAM: MRI ABDOMEN WITHOUT AND WITH CONTRAST (INCLUDING MRCP) TECHNIQUE: Multiplanar multisequence MR imaging of the abdomen was performed both before and after the administration of intravenous contrast. Heavily T2-weighted images of the biliary and pancreatic ducts were obtained, and three-dimensional MRCP images were rendered by post processing. CONTRAST:  9mL GADAVIST GADOBUTROL 1 MMOL/ML IV SOLN COMPARISON:  Ultrasound on 01/14/2023 FINDINGS: Lower chest: No acute findings. Hepatobiliary: No hepatic masses identified. Gallstones are better demonstrated on recent ultrasound. Gallbladder is nondistended, however, there is diffuse gallbladder wall thickening pericholecystic edema, consistent with acute cholecystitis. Diffuse biliary ductal dilatation is seen, with common bile duct measuring 12 mm in diameter. Few calculi are seen in the distal common bile duct measuring up to 5 mm. Pancreas: No mass or inflammatory changes. No evidence of pancreatic ductal dilatation. Spleen:  Within normal limits in size and appearance. Adrenals/Urinary Tract: No suspicious masses identified. No evidence of hydronephrosis. Stomach/Bowel: Unremarkable. Vascular/Lymphatic: No pathologically enlarged lymph nodes identified. No acute vascular findings. Other:  None. Musculoskeletal:  No suspicious bone lesions identified. IMPRESSION: Acute cholecystitis. Diffuse biliary ductal dilatation due to choledocholithiasis. Electronically Signed   By: Danae Orleans M.D.   On: 01/16/2023 10:16    Anti-infectives: Anti-infectives (From admission, onward)    Start     Dose/Rate Route Frequency Ordered Stop   01/14/23 2200  piperacillin-tazobactam (ZOSYN) IVPB 3.375 g        3.375 g 12.5 mL/hr over 240 Minutes Intravenous Every 8 hours 01/14/23 1613 01/21/23 2159   01/14/23 1545  piperacillin-tazobactam (ZOSYN) IVPB 3.375 g        3.375 g 100 mL/hr over 30 Minutes Intravenous  Once  01/14/23 1533 01/14/23 1745        Assessment/Plan POD 2 s/p laparoscopic cholecystectomy by Dr. Cliffton Asters for Acute Cholecystitis on 01/15/23 - Post op MRCP w/ Choledocholithiasis. GI planning ERCP 10/4. LFT's pending this am.  - Cont abx until after ERCP. Anticipate to stop after ERCP/before discharge - Okay for FLD today. NPO at midnight.  - Mobilize - Pulm toilet  FEN - NPO till MRCP results. Cont IVF at 43ml/hr. Bowel regimen.  VTE - SCDs, Lovenox ID - Zosyn >>  Afebrile. No tachycardia or systolic hypotension.    LOS: 1 day    Jacinto Halim , [facility] 01/17/2023, 8:27 AM Please see Amion for pager number during day hours 7:00am-4:30pm

## 2023-01-17 NOTE — Plan of Care (Signed)

## 2023-01-17 NOTE — H&P (View-Only) (Signed)
Attending physician's note   I have taken a history, reviewed the chart, and examined the patient. I performed a substantive portion of this encounter, including complete performance of at least one of the key components, in conjunction with the APP. I agree with the APP's note, impression, and recommendations with my edits.   Liver enzymes essentially stable from yesterday, and overall improved from earlier on admission. Tbili 2.3 and ALP 195. Otherwise feeling ok and no complaints at this time.   Plan for ERCP with Dr. Marina Goodell tomorrow.  NPO at [employer] as outlined  Doristine Locks, DO, FACG [phone] office          Daily Progress Note  DOA: 01/14/2023 Hospital Day: 4  Chief Complaint:  choledocholithiasis   ASSESSMENT    Brief Narrative:  Betty Sellers is a 47 y.o. year old female with a history of cholelithiasis,  status post laparoscopic cholecystectomy this admission . GI following for cholelithiasis found on post-op MRCP  Acute cholecystitis s/p lap cholecystectomy / choledocholithiasis LFTs remain elevated. WBC normal.   Chronic constipation managed with daily Miralax   Colon cancer screening. She will be 47 yo in a few weeks.    PLAN   Scheduled for ERCP tomorrow at 1:30pm.  NPO after MN On Zosyn Will stop [place] Lovenox after today's dose   Subjective   Abdominal bloating better after passage of flatus. No complaints. Tolerating liquids   Objective    Recent Labs    01/14/23 1647 01/15/23 0223  WBC 10.1 7.1  HGB 12.4 11.5*  HCT 36.7 33.8*  PLT 330 282   BMET Recent Labs    01/15/23 0223 01/16/23 0549 01/17/23 0952  NA 136 136 139  K 3.3* 4.2 4.9  CL 104 103 102  CO2 24 25 29   GLUCOSE 107* 132* 103*  BUN <5* 5* 6  CREATININE 0.82 0.82 0.86  CALCIUM 8.1* 8.4* 8.6*   LFT Recent Labs    01/17/23 0952  PROT 6.8  ALBUMIN 3.0*  AST 251*  ALT 573*  ALKPHOS 195*  BILITOT 2.3*   PT/INR Recent Labs     01/15/23 0223  LABPROT 17.0*  INR 1.4*     Imaging:  MR ABDOMEN MRCP W WO CONTAST CLINICAL DATA:  Right upper quadrant pain. Cholelithiasis and biliary ductal dilatation on recent ultrasound.  EXAM: MRI ABDOMEN WITHOUT AND WITH CONTRAST (INCLUDING MRCP)  TECHNIQUE: Multiplanar multisequence MR imaging of the abdomen was performed both before and after the administration of intravenous contrast. Heavily T2-weighted images of the biliary and pancreatic ducts were obtained, and three-dimensional MRCP images were rendered by post processing.  CONTRAST:  9mL GADAVIST GADOBUTROL 1 MMOL/ML IV SOLN  COMPARISON:  Ultrasound on 01/14/2023  FINDINGS: Lower chest: No acute findings.  Hepatobiliary: No hepatic masses identified. Gallstones are better demonstrated on recent ultrasound. Gallbladder is nondistended, however, there is diffuse gallbladder wall thickening pericholecystic edema, consistent with acute cholecystitis.  Diffuse biliary ductal dilatation is seen, with common bile duct measuring 12 mm in diameter. Few calculi are seen in the distal common bile duct measuring up to 5 mm.  Pancreas: No mass or inflammatory changes. No evidence of pancreatic ductal dilatation.  Spleen:  Within normal limits in size and appearance.  Adrenals/Urinary Tract: No suspicious masses identified. No evidence of hydronephrosis.  Stomach/Bowel: Unremarkable.  Vascular/Lymphatic: No pathologically enlarged lymph nodes identified. No acute vascular findings.  Other:  None.  Musculoskeletal:  No suspicious bone lesions identified.  IMPRESSION: Acute cholecystitis.  Diffuse biliary ductal dilatation due to choledocholithiasis.  Electronically Signed   By: Danae Orleans M.D.   On: 01/16/2023 10:16 MR 3D Recon At Scanner CLINICAL DATA:  Right upper quadrant pain. Cholelithiasis and biliary ductal dilatation on recent ultrasound.  EXAM: MRI ABDOMEN WITHOUT AND WITH CONTRAST  (INCLUDING MRCP)  TECHNIQUE: Multiplanar multisequence MR imaging of the abdomen was performed both before and after the administration of intravenous contrast. Heavily T2-weighted images of the biliary and pancreatic ducts were obtained, and three-dimensional MRCP images were rendered by post processing.  CONTRAST:  9mL GADAVIST GADOBUTROL 1 MMOL/ML IV SOLN  COMPARISON:  Ultrasound on 01/14/2023  FINDINGS: Lower chest: No acute findings.  Hepatobiliary: No hepatic masses identified. Gallstones are better demonstrated on recent ultrasound. Gallbladder is nondistended, however, there is diffuse gallbladder wall thickening pericholecystic edema, consistent with acute cholecystitis.  Diffuse biliary ductal dilatation is seen, with common bile duct measuring 12 mm in diameter. Few calculi are seen in the distal common bile duct measuring up to 5 mm.  Pancreas: No mass or inflammatory changes. No evidence of pancreatic ductal dilatation.  Spleen:  Within normal limits in size and appearance.  Adrenals/Urinary Tract: No suspicious masses identified. No evidence of hydronephrosis.  Stomach/Bowel: Unremarkable.  Vascular/Lymphatic: No pathologically enlarged lymph nodes identified. No acute vascular findings.  Other:  None.  Musculoskeletal:  No suspicious bone lesions identified.  IMPRESSION: Acute cholecystitis.  Diffuse biliary ductal dilatation due to choledocholithiasis.  Electronically Signed   By: Danae Orleans M.D.   On: 01/16/2023 10:16     Scheduled inpatient medications:  . acetaminophen  1,000 mg Oral Q6H  . enoxaparin (LOVENOX) injection  40 mg Subcutaneous Q24H  . polyethylene glycol  17 g Oral BID  . senna  1 tablet Oral BID   Continuous inpatient infusions:  . dextrose 5 % and 0.45 % NaCl with KCl 20 mEq/L 75 mL/hr at 01/17/23 0417  . piperacillin-tazobactam (ZOSYN)  IV 3.375 g (01/17/23 0500)   PRN inpatient medications: diphenhydrAMINE **OR**  diphenhydrAMINE, HYDROmorphone (DILAUDID) injection, metoprolol tartrate, ondansetron **OR** ondansetron (ZOFRAN) IV, oxyCODONE, simethicone  Vital signs in last 24 hours: Temp:  [98.4 F (36.9 C)-98.6 F (37 C)] 98.4 F (36.9 C) (10/03 0859) Pulse Rate:  [61-72] 61 (10/03 0859) Resp:  [17-18] 17 (10/03 0859) BP: (107-112)/(63-68) 112/63 (10/03 0859) SpO2:  [98 %-100 %] 98 % (10/03 0859) Last BM Date :  (PTA)  Intake/Output Summary (Last 24 hours) at 01/17/2023 1134 Last data filed at 01/17/2023 0417 Gross per 24 hour  Intake 2108.58 ml  Output --  Net 2108.58 ml    Intake/Output from previous day: 10/02 0701 - 10/03 0700 In: 2108.6 [P.O.:240; I.V.:1630.5; IV Piggyback:238.1] Out: -  Intake/Output this shift: No intake/output data recorded.   Physical Exam:  General: Alert female in NAD Heart:  Regular rate and rhythm.  Pulmonary: Normal respiratory effort Abdomen: Soft, nondistended, nontender. Normal bowel sounds. Extremities: No lower extremity edema  Neurologic: Alert and oriented Psych: Pleasant. Cooperative. Insight appears normal.    Principal Problem:   Acute cholecystitis     LOS: 1 day   Willette Cluster ,NP 01/17/2023, 11:34 AM

## 2023-01-17 NOTE — Plan of Care (Signed)

## 2023-01-17 NOTE — Progress Notes (Addendum)
Attending physician's note   I have taken a history, reviewed the chart, and examined the patient. I performed a substantive portion of this encounter, including complete performance of at least one of the key components, in conjunction with the APP. I agree with the APP's note, impression, and recommendations with my edits.   Liver enzymes essentially stable from yesterday, and overall improved from earlier on admission. Tbili 2.3 and ALP 195. Otherwise feeling ok and no complaints at this time.   Plan for ERCP with Dr. Marina Goodell tomorrow.  NPO at [facility] [employer] as outlined  Doristine Locks, DO, FACG [phone] office          Daily Progress Note  DOA: 01/14/2023 Hospital Day: 4  Chief Complaint:  choledocholithiasis   ASSESSMENT    Brief Narrative:  Betty Sellers is a 47 y.o. year old female with a history of cholelithiasis,  status post laparoscopic cholecystectomy this admission . GI following for cholelithiasis found on post-op MRCP  Acute cholecystitis s/p lap cholecystectomy / choledocholithiasis LFTs remain elevated. WBC normal.   Chronic constipation managed with daily Miralax   Colon cancer screening. She will be 47 yo in a few weeks.    PLAN   Scheduled for ERCP tomorrow at 1:30pm.  NPO after MN On Zosyn Will stop [place] Lovenox after today's dose   Subjective   Abdominal bloating better after passage of flatus. No complaints. Tolerating liquids   Objective    Recent Labs    01/14/23 1647 01/15/23 0223  WBC 10.1 7.1  HGB 12.4 11.5*  HCT 36.7 33.8*  PLT 330 282   BMET Recent Labs    01/15/23 0223 01/16/23 0549 01/17/23 0952  NA 136 136 139  K 3.3* 4.2 4.9  CL 104 103 102  CO2 24 25 29   GLUCOSE 107* 132* 103*  BUN <5* 5* 6  CREATININE 0.82 0.82 0.86  CALCIUM 8.1* 8.4* 8.6*   LFT Recent Labs    01/17/23 0952  PROT 6.8  ALBUMIN 3.0*  AST 251*  ALT 573*  ALKPHOS 195*  BILITOT 2.3*   PT/INR Recent Labs     01/15/23 0223  LABPROT 17.0*  INR 1.4*     Imaging:  MR ABDOMEN MRCP W WO CONTAST CLINICAL DATA:  Right upper quadrant pain. Cholelithiasis and biliary ductal dilatation on recent ultrasound.  EXAM: MRI ABDOMEN WITHOUT AND WITH CONTRAST (INCLUDING MRCP)  TECHNIQUE: Multiplanar multisequence MR imaging of the abdomen was performed both before and after the administration of intravenous contrast. Heavily T2-weighted images of the biliary and pancreatic ducts were obtained, and three-dimensional MRCP images were rendered by post processing.  CONTRAST:  9mL GADAVIST GADOBUTROL 1 MMOL/ML IV SOLN  COMPARISON:  Ultrasound on 01/14/2023  FINDINGS: Lower chest: No acute findings.  Hepatobiliary: No hepatic masses identified. Gallstones are better demonstrated on recent ultrasound. Gallbladder is nondistended, however, there is diffuse gallbladder wall thickening pericholecystic edema, consistent with acute cholecystitis.  Diffuse biliary ductal dilatation is seen, with common bile duct measuring 12 mm in diameter. Few calculi are seen in the distal common bile duct measuring up to 5 mm.  Pancreas: No mass or inflammatory changes. No evidence of pancreatic ductal dilatation.  Spleen:  Within normal limits in size and appearance.  Adrenals/Urinary Tract: No suspicious masses identified. No evidence of hydronephrosis.  Stomach/Bowel: Unremarkable.  Vascular/Lymphatic: No pathologically enlarged lymph nodes identified. No acute vascular findings.  Other:  None.  Musculoskeletal:  No suspicious bone lesions identified.  IMPRESSION: Acute cholecystitis.  Diffuse biliary ductal dilatation due to choledocholithiasis.  Electronically Signed   By: Danae Orleans M.D.   On: 01/16/2023 10:16 MR 3D Recon At Scanner CLINICAL DATA:  Right upper quadrant pain. Cholelithiasis and biliary ductal dilatation on recent ultrasound.  EXAM: MRI ABDOMEN WITHOUT AND WITH CONTRAST  (INCLUDING MRCP)  TECHNIQUE: Multiplanar multisequence MR imaging of the abdomen was performed both before and after the administration of intravenous contrast. Heavily T2-weighted images of the biliary and pancreatic ducts were obtained, and three-dimensional MRCP images were rendered by post processing.  CONTRAST:  9mL GADAVIST GADOBUTROL 1 MMOL/ML IV SOLN  COMPARISON:  Ultrasound on 01/14/2023  FINDINGS: Lower chest: No acute findings.  Hepatobiliary: No hepatic masses identified. Gallstones are better demonstrated on recent ultrasound. Gallbladder is nondistended, however, there is diffuse gallbladder wall thickening pericholecystic edema, consistent with acute cholecystitis.  Diffuse biliary ductal dilatation is seen, with common bile duct measuring 12 mm in diameter. Few calculi are seen in the distal common bile duct measuring up to 5 mm.  Pancreas: No mass or inflammatory changes. No evidence of pancreatic ductal dilatation.  Spleen:  Within normal limits in size and appearance.  Adrenals/Urinary Tract: No suspicious masses identified. No evidence of hydronephrosis.  Stomach/Bowel: Unremarkable.  Vascular/Lymphatic: No pathologically enlarged lymph nodes identified. No acute vascular findings.  Other:  None.  Musculoskeletal:  No suspicious bone lesions identified.  IMPRESSION: Acute cholecystitis.  Diffuse biliary ductal dilatation due to choledocholithiasis.  Electronically Signed   By: Danae Orleans M.D.   On: 01/16/2023 10:16     Scheduled inpatient medications:  . acetaminophen  1,000 mg Oral Q6H  . enoxaparin (LOVENOX) injection  40 mg Subcutaneous Q24H  . polyethylene glycol  17 g Oral BID  . senna  1 tablet Oral BID   Continuous inpatient infusions:  . dextrose 5 % and 0.45 % NaCl with KCl 20 mEq/L 75 mL/hr at 01/17/23 0417  . piperacillin-tazobactam (ZOSYN)  IV 3.375 g (01/17/23 0500)   PRN inpatient medications: diphenhydrAMINE **OR**  diphenhydrAMINE, HYDROmorphone (DILAUDID) injection, metoprolol tartrate, ondansetron **OR** ondansetron (ZOFRAN) IV, oxyCODONE, simethicone  Vital signs in last 24 hours: Temp:  [98.4 F (36.9 C)-98.6 F (37 C)] 98.4 F (36.9 C) (10/03 0859) Pulse Rate:  [61-72] 61 (10/03 0859) Resp:  [17-18] 17 (10/03 0859) BP: (107-112)/(63-68) 112/63 (10/03 0859) SpO2:  [98 %-100 %] 98 % (10/03 0859) Last BM Date :  (PTA)  Intake/Output Summary (Last 24 hours) at 01/17/2023 1134 Last data filed at 01/17/2023 0417 Gross per 24 hour  Intake 2108.58 ml  Output --  Net 2108.58 ml    Intake/Output from previous day: 10/02 0701 - 10/03 0700 In: 2108.6 [P.O.:240; I.V.:1630.5; IV Piggyback:238.1] Out: -  Intake/Output this shift: No intake/output data recorded.   Physical Exam:  General: Alert female in NAD Heart:  Regular rate and rhythm.  Pulmonary: Normal respiratory effort Abdomen: Soft, nondistended, nontender. Normal bowel sounds. Extremities: No lower extremity edema  Neurologic: Alert and oriented Psych: Pleasant. Cooperative. Insight appears normal.    Principal Problem:   Acute cholecystitis     LOS: 1 day   Willette Cluster ,NP 01/17/2023, 11:34 AM

## 2023-01-18 ENCOUNTER — Encounter (HOSPITAL_COMMUNITY): Payer: Self-pay

## 2023-01-18 ENCOUNTER — Inpatient Hospital Stay (HOSPITAL_COMMUNITY): Payer: BC Managed Care – PPO | Admitting: Registered Nurse

## 2023-01-18 ENCOUNTER — Encounter (HOSPITAL_COMMUNITY): Admission: EM | Disposition: A | Discharge status: Home / Self Care | Payer: Self-pay | Source: Home / Self Care

## 2023-01-18 ENCOUNTER — Inpatient Hospital Stay (HOSPITAL_COMMUNITY): Payer: BC Managed Care – PPO

## 2023-01-18 DIAGNOSIS — K8050 Calculus of bile duct without cholangitis or cholecystitis without obstruction: Secondary | ICD-10-CM | POA: Diagnosis not present

## 2023-01-18 HISTORY — PX: SPHINCTEROTOMY: SHX5544

## 2023-01-18 HISTORY — PX: HEMOSTASIS CONTROL: SHX6838

## 2023-01-18 HISTORY — PX: REMOVAL OF STONES: SHX5545

## 2023-01-18 HISTORY — PX: ENDOSCOPIC RETROGRADE CHOLANGIOPANCREATOGRAPHY (ERCP) WITH PROPOFOL: SHX5810

## 2023-01-18 LAB — COMPREHENSIVE METABOLIC PANEL
ALT: 466 U/L — ABNORMAL HIGH (ref 0–44)
AST: 219 U/L — ABNORMAL HIGH (ref 15–41)
Albumin: 2.5 g/dL — ABNORMAL LOW (ref 3.5–5.0)
Alkaline Phosphatase: 172 U/L — ABNORMAL HIGH (ref 38–126)
Anion gap: 7 (ref 5–15)
BUN: <5 mg/dL — ABNORMAL LOW (ref 6–20)
CO2: 28 mmol/L (ref 22–32)
Calcium: 7.9 mg/dL — ABNORMAL LOW (ref 8.9–10.3)
Chloride: 102 mmol/L (ref 98–111)
Creatinine, Ser: 0.73 mg/dL (ref 0.44–1.00)
GFR, Estimated: >60 mL/min (ref >60)
Glucose, Bld: 122 mg/dL — ABNORMAL HIGH (ref 70–99)
Potassium: 3.6 mmol/L (ref 3.5–5.1)
Sodium: 137 mmol/L (ref 135–145)
Total Bilirubin: 2.2 mg/dL — ABNORMAL HIGH (ref 0.3–1.2)
Total Protein: 5.9 g/dL — ABNORMAL LOW (ref 6.5–8.1)

## 2023-01-18 SURGERY — ENDOSCOPIC RETROGRADE CHOLANGIOPANCREATOGRAPHY (ERCP) WITH PROPOFOL
Anesthesia: General

## 2023-01-18 MED ORDER — ONDANSETRON HCL 4 MG/2ML IJ SOLN
INTRAMUSCULAR | Status: DC | PRN
  Administered 2023-01-18 (×2): 4 mg via INTRAVENOUS

## 2023-01-18 MED ORDER — PROPOFOL 10 MG/ML IV BOLUS
INTRAVENOUS | Status: DC | PRN
  Administered 2023-01-18: 25 ug/kg/min via INTRAVENOUS
  Administered 2023-01-18: 200 mg via INTRAVENOUS

## 2023-01-18 MED ORDER — LIDOCAINE 2% (20 MG/ML) 5 ML SYRINGE
INTRAMUSCULAR | Status: DC | PRN
  Administered 2023-01-18: 100 mg via INTRAVENOUS

## 2023-01-18 MED ORDER — INDOMETHACIN 50 MG RE SUPP
RECTAL | Status: AC
  Filled 2023-01-18: qty 2

## 2023-01-18 MED ORDER — SUGAMMADEX SODIUM 200 MG/2ML IV SOLN
INTRAVENOUS | Status: DC | PRN
  Administered 2023-01-18 (×2): 200 mg via INTRAVENOUS

## 2023-01-18 MED ORDER — FENTANYL CITRATE (PF) 250 MCG/5ML IJ SOLN
INTRAMUSCULAR | Status: DC | PRN
  Administered 2023-01-18 (×2): 50 ug via INTRAVENOUS

## 2023-01-18 MED ORDER — DEXAMETHASONE SODIUM PHOSPHATE 10 MG/ML IJ SOLN
INTRAMUSCULAR | Status: DC | PRN
  Administered 2023-01-18: 10 mg via INTRAVENOUS

## 2023-01-18 MED ORDER — GLUCAGON HCL RDNA (DIAGNOSTIC) 1 MG IJ SOLR
INTRAMUSCULAR | Status: DC | PRN
Start: 2023-01-18 — End: 2023-01-18
  Administered 2023-01-18 (×2): .5 mg via INTRAVENOUS

## 2023-01-18 MED ORDER — INDOMETHACIN 50 MG RE SUPP
RECTAL | Status: DC | PRN
  Administered 2023-01-18: 100 mg via RECTAL

## 2023-01-18 MED ORDER — GLUCAGON HCL RDNA (DIAGNOSTIC) 1 MG IJ SOLR
INTRAMUSCULAR | Status: AC
  Filled 2023-01-18: qty 1

## 2023-01-18 MED ORDER — CIPROFLOXACIN IN D5W 400 MG/200ML IV SOLN
INTRAVENOUS | Status: AC
  Filled 2023-01-18: qty 200

## 2023-01-18 MED ORDER — FENTANYL CITRATE (PF) 100 MCG/2ML IJ SOLN
INTRAMUSCULAR | Status: AC
  Filled 2023-01-18: qty 2

## 2023-01-18 MED ORDER — SODIUM CHLORIDE 0.9 % IV SOLN
12.5000 mg | Freq: Three times a day (TID) | INTRAVENOUS | Status: DC | PRN
  Administered 2023-01-18: 12.5 mg via INTRAVENOUS
  Filled 2023-01-18: qty 0.5

## 2023-01-18 MED ORDER — SODIUM CHLORIDE 0.9 % IV SOLN
INTRAVENOUS | Status: DC | PRN
  Administered 2023-01-18: 60 mL

## 2023-01-18 MED ORDER — LACTATED RINGERS IV SOLN
INTRAVENOUS | Status: AC | PRN
Start: 2023-01-18 — End: 2023-01-18
  Administered 2023-01-18: 1000 mL via INTRAVENOUS

## 2023-01-18 MED ORDER — ROCURONIUM BROMIDE 10 MG/ML (PF) SYRINGE
PREFILLED_SYRINGE | INTRAVENOUS | Status: DC | PRN
  Administered 2023-01-18: 60 mg via INTRAVENOUS
  Administered 2023-01-18: 20 mg via INTRAVENOUS

## 2023-01-18 MED ORDER — INDOMETHACIN 50 MG RE SUPP: 100.0000 mg | Freq: Once | RECTAL | Status: DC

## 2023-01-18 MED ORDER — PHENYLEPHRINE 80 MCG/ML (10ML) SYRINGE FOR IV PUSH (FOR BLOOD PRESSURE SUPPORT)
PREFILLED_SYRINGE | INTRAVENOUS | Status: DC | PRN
  Administered 2023-01-18 (×3): 80 ug via INTRAVENOUS

## 2023-01-18 NOTE — Plan of Care (Signed)

## 2023-01-18 NOTE — Progress Notes (Signed)
3 Days Post-Op  Subjective: CC: RUQ pain has resolved. Complains of pain near her umbilical incision only this am. T. Bili still remains elevated at 2.2 this am. Awaiting ERCP today.  Tolerated fld yesterday but had some nausea this am. BM this am. Voiding. Mobilizing. Currently NPO.   Objective: Vital signs in last 24 hours: Temp:  [98.2 F (36.8 C)-98.4 F (36.9 C)] 98.3 F (36.8 C) (10/04 0414) Pulse Rate:  [61-73] 62 (10/04 0414) Resp:  [17-21] 20 (10/04 0414) BP: (103-115)/(56-78) 115/78 (10/04 0414) SpO2:  [98 %-100 %] 100 % (10/04 0414) Last BM Date : 01/17/23  Intake/Output from previous day: 10/03 0701 - 10/04 0700 In: 1226.8 [P.O.:960; I.V.:121.8; IV Piggyback:145] Out: -  Intake/Output this shift: No intake/output data recorded.  PE: Gen:  Alert, NAD, pleasant Card:  RRR Pulm:  CTAB, no W/R/R, effort normal Abd: Soft, ND, appropriately tender around laparoscopic incisions, no rigidity or guarding and otherwise NT, +BS. Incisions with glue intact appears well and are without drainage, bleeding, or signs of infection  Psych: A&Ox3   Lab Results:  No results for input(s): "WBC", "HGB", "HCT", "PLT" in the last 72 hours.  BMET Recent Labs    01/17/23 0952 01/18/23 0401  NA 139 137  K 4.9 3.6  CL 102 102  CO2 29 28  GLUCOSE 103* 122*  BUN 6 <5*  CREATININE 0.86 0.73  CALCIUM 8.6* 7.9*   PT/INR No results for input(s): "LABPROT", "INR" in the last 72 hours.  CMP     Component Value Date/Time   NA 137 01/18/2023 0401   NA 138 03/23/2021 0920   K 3.6 01/18/2023 0401   CL 102 01/18/2023 0401   CO2 28 01/18/2023 0401   GLUCOSE 122 (H) 01/18/2023 0401   BUN <5 (L) 01/18/2023 0401   BUN 9 03/23/2021 0920   CREATININE 0.73 01/18/2023 0401   CALCIUM 7.9 (L) 01/18/2023 0401   PROT 5.9 (L) 01/18/2023 0401   PROT 6.7 03/23/2021 0920   ALBUMIN 2.5 (L) 01/18/2023 0401   ALBUMIN 4.0 03/23/2021 0920   AST 219 (H) 01/18/2023 0401   ALT 466 (H)  01/18/2023 0401   ALKPHOS 172 (H) 01/18/2023 0401   BILITOT 2.2 (H) 01/18/2023 0401   BILITOT 0.3 03/23/2021 0920   GFRNONAA >60 01/18/2023 0401   Lipase     Component Value Date/Time   LIPASE 34 01/14/2023 1229    Studies/Results: No results found.  Anti-infectives: Anti-infectives (From admission, onward)    Start     Dose/Rate Route Frequency Ordered Stop   01/14/23 2200  piperacillin-tazobactam (ZOSYN) IVPB 3.375 g        3.375 g 12.5 mL/hr over 240 Minutes Intravenous Every 8 hours 01/14/23 1613 01/21/23 2159   01/14/23 1545  piperacillin-tazobactam (ZOSYN) IVPB 3.375 g        3.375 g 100 mL/hr over 30 Minutes Intravenous  Once 01/14/23 1533 01/14/23 1745        Assessment/Plan POD 3 s/p laparoscopic cholecystectomy by Dr. Cliffton Asters for Acute Cholecystitis on 01/15/23 - Post op MRCP w/ Choledocholithiasis. GI planning ERCP today. - Cont abx until after ERCP. Anticipate to stop after ERCP/before discharge - Mobilize - Pulm toilet  FEN - NPO for ERCP. Okay for diet post ERCP  VTE - SCDs, Lovenox ID - Zosyn >>  Afebrile. No tachycardia or systolic hypotension.    LOS: 2 days    Jacinto Halim , [facility] Surgery 01/18/2023, 8:51 AM Please see  Amion for pager number during day hours 7:00am-4:30pm

## 2023-01-18 NOTE — Transfer of Care (Signed)
Immediate Anesthesia Transfer of Care Note  Patient: Betty Sellers  Procedure(s) Performed: ENDOSCOPIC RETROGRADE CHOLANGIOPANCREATOGRAPHY (ERCP) WITH PROPOFOL SPHINCTEROTOMY REMOVAL OF STONES HEMOSTASIS CONTROL  Patient Location: PACU  Anesthesia Type:General  Level of Consciousness: awake, alert , and oriented  Airway & Oxygen Therapy: Patient Spontanous Breathing  Post-op Assessment: Report given to RN, Post -op Vital signs reviewed and stable, and Patient moving all extremities X 4  Post vital signs: Reviewed and stable  Last Vitals:  Vitals Value Taken Time  BP 118/64   Temp    Pulse 68 01/18/23 1622  Resp    SpO2 99 % 01/18/23 1622  Vitals shown include unfiled device data.  Last Pain:  Vitals:   01/18/23 1356  TempSrc: Temporal  PainSc: 0-No pain      Patients Stated Pain Goal: 0 (01/18/23 0955)  Complications: There were no known notable events for this encounter.

## 2023-01-18 NOTE — Op Note (Signed)
[facility] Patient Name: Betty Sellers Procedure Date : 01/18/2023 MRN: [id number] Attending MD: Wilhemina Bonito. Marina Goodell , MD, [id number] Date of Birth: 1975/08/08 CSN: [id number] Age: 47 Admit Type: Inpatient Procedure:                ERCP with biliary sphincterotomy and common duct                            stone extraction Indications:              Bile duct stones on MRCP Providers:                Wilhemina Bonito. Marina Goodell, MD, Jacquelyn "Jaci" Clelia Croft, RN, Stephens Shire RN, RN, Marja Kays, Technician Referring MD:             [facility] surgery Medicines:                General Anesthesia Complications:            No immediate complications. Estimated Blood Loss:     Estimated blood loss was minimal. Procedure:                Pre-Anesthesia Assessment:                           - Prior to the procedure, a History and Physical                            was performed, and patient medications and                            allergies were reviewed. The patient is competent.                            The risks and benefits of the procedure and the                            sedation options and risks were discussed with the                            patient. All questions were answered and informed                            consent was obtained. Patient identification and                            proposed procedure were verified by the physician.                            Mental Status Examination: alert and oriented.                            Airway Examination: normal oropharyngeal airway and  neck mobility. Respiratory Examination: clear to                            auscultation. CV Examination: normal. Prophylactic                            Antibiotics: The patient does not require                            prophylactic antibiotics. Prior Anticoagulants: The                            patient has taken no  anticoagulant or antiplatelet                            agents. ASA Grade Assessment: I - A normal, healthy                            patient. After reviewing the risks and benefits,                            the patient was deemed in satisfactory condition to                            undergo the procedure. The anesthesia plan was to                            use moderate sedation / analgesia (conscious                            sedation). Immediately prior to administration of                            medications, the patient was re-assessed for                            adequacy to receive sedatives. The heart rate,                            respiratory rate, oxygen saturations, blood                            pressure, adequacy of pulmonary ventilation, and                            response to care were monitored throughout the                            procedure. The physical status of the patient was                            re-assessed after the procedure.  After obtaining informed consent, the scope was                            passed under direct vision. Throughout the                            procedure, the patient's blood pressure, pulse, and                            oxygen saturations were monitored continuously. The                            [id number]-Q190V ([id number] Olympus duodenoscope was                            introduced through the mouth, and used to inject                            contrast into and used to inject contrast into the                            bile duct. The ERCP was accomplished without                            difficulty. The patient tolerated the procedure                            well. Scope In: Scope Out: Findings:      1. The side-viewing duodena scope was passed blindly into the esophagus.      2. The stomach and duodenum were normal. 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 The  major ampulla was normal but       elongated and hooded. The minor papilla was not sought.      3. A scout radiograph of the abdomen with the endoscope and position       revealed cholecystectomy clips.      4. Initial injection of contrast yielded a partial cholangiogram. The       bile duct was then selectively cannulated with a hydrophilic wire.      5. Injection of contrast yielded a moderately dilated biliary system.       Multiple small filling defects consistent with stones were noted.      6. A biliary sphincterotomy was made using the ERBE system with cutting       at the 12:00 orientation.      7. Multiple cholesterol type stones measuring 5 mm or less, were removed.      8. There was moderate oozing of blood from the sphincterotomy site after       successful stone extraction. This was observed and resolved at       procedures end (see image). Other than short-term tamponade, no       treatment required      9. There was no manipulation or injection of the pancreatic duct Impression:               1. Choledocholithiasis status post ERC with biliary  sphincterotomy and stone extraction                           2. Self-limited sphincterotomy bleeding as described                           3. 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 Recommendation:           1. Observe patient's clinical course.                           2. Trend LFTs                           3. Liquid diet. Advance as tolerated                           Discussed with patient. She was provided a copy of                            this procedure report. GI inpatient team to see                            this patient as needed. Thanks Procedure Code(s):        --- Professional ---                           [phone], Endoscopic retrograde                            cholangiopancreatography (ERCP); with removal of                             calculi/debris from biliary/pancreatic duct(s)                           43262, Endoscopic retrograde                            cholangiopancreatography (ERCP); with                            sphincterotomy/papillotomy Diagnosis Code(s):        --- Professional ---                           Z90.49, Acquired absence of other specified parts                            of digestive tract                           K80.50, Calculus of bile duct without cholangitis  or cholecystitis without obstruction                           K83.8, Other specified diseases of biliary tract CPT copyright 2022 American Medical Association. All rights reserved. The codes documented in this report are preliminary and upon coder review may  be revised to meet current compliance requirements. Wilhemina Bonito. Marina Goodell, MD 01/18/2023 4:45:17 PM This report has been signed electronically. Number of Addenda: 0

## 2023-01-18 NOTE — Plan of Care (Signed)

## 2023-01-18 NOTE — Anesthesia Postprocedure Evaluation (Signed)
Anesthesia Post Note  Patient: Transport planner  Procedure(s) Performed: ENDOSCOPIC RETROGRADE CHOLANGIOPANCREATOGRAPHY (ERCP) WITH PROPOFOL SPHINCTEROTOMY REMOVAL OF STONES HEMOSTASIS CONTROL     Patient location during evaluation: PACU Anesthesia Type: General Level of consciousness: awake and alert Pain management: pain level controlled Vital Signs Assessment: post-procedure vital signs reviewed and stable Respiratory status: spontaneous breathing, nonlabored ventilation and respiratory function stable Cardiovascular status: blood pressure returned to baseline Postop Assessment: no apparent nausea or vomiting Anesthetic complications: no   There were no known notable events for this encounter.  Last Vitals:  Vitals:   01/18/23 1630 01/18/23 1640  BP: 111/73 110/65  Pulse: (!) 58 (!) 57  Resp: 17 13  Temp:    SpO2: 98% 94%    Last Pain:  Vitals:   01/18/23 1640  TempSrc:   PainSc: 0-No pain   Pain Goal: Patients Stated Pain Goal: 0 (01/18/23 0955)                 Shanda Howells

## 2023-01-18 NOTE — Anesthesia Preprocedure Evaluation (Addendum)
Anesthesia Evaluation    Reviewed: Allergy & Precautions, Patient's Chart, lab work & pertinent test results, Unable to perform ROS - Chart review only  History of Anesthesia Complications Negative for: history of anesthetic complications  Airway Mallampati: II  TM Distance: >3 FB Neck ROM: Full    Dental  (+) Dental Advisory Given, Teeth Intact   Pulmonary former smoker   Pulmonary exam normal        Cardiovascular negative cardio ROS Normal cardiovascular exam     Neuro/Psych negative neurological ROS  negative psych ROS   GI/Hepatic negative GI ROS, Neg liver ROS,,,  Endo/Other   K 3.3 Obesity   Renal/GU negative Renal ROS     Musculoskeletal negative musculoskeletal ROS (+)    Abdominal  (+) + obese  Peds  Hematology  (+) Blood dyscrasia, anemia   Anesthesia Other Findings   Reproductive/Obstetrics                             Anesthesia Physical Anesthesia Plan  ASA: 2  Anesthesia Plan: General   Post-op Pain Management: Minimal or no pain anticipated   Induction: Intravenous  PONV Risk Score and Plan: 3 and Treatment may vary due to age or medical condition, Ondansetron, Dexamethasone and Midazolam  Airway Management Planned: Oral ETT  Additional Equipment: None  Intra-op Plan:   Post-operative Plan: Extubation in OR  Informed Consent:   Plan Discussed with:   Anesthesia Plan Comments:        Anesthesia Quick Evaluation

## 2023-01-18 NOTE — Interval H&P Note (Signed)
History and Physical Interval Note:  01/18/2023 1:52 PM  Betty Sellers  has presented today for surgery, with the diagnosis of choledocholithiasis.  The various methods of treatment have been discussed with the patient and family. After consideration of risks, benefits and other options for treatment, the patient has consented to  Procedure(s): ENDOSCOPIC RETROGRADE CHOLANGIOPANCREATOGRAPHY (ERCP) WITH PROPOFOL (N/A) as a surgical intervention.  The patient's history has been reviewed, patient examined, no change in status, stable for surgery.  I have reviewed the patient's chart and labs.  Questions were answered to the patient's satisfaction.     Yancey Flemings

## 2023-01-18 NOTE — Anesthesia Procedure Notes (Addendum)
Procedure Name: Intubation Date/Time: 01/18/2023 3:09 PM  Performed by: Sharyn Dross, CRNAPre-anesthesia Checklist: Patient identified, Emergency Drugs available, Suction available and Patient being monitored Patient Re-evaluated:Patient Re-evaluated prior to induction Oxygen Delivery Method: Circle system utilized Preoxygenation: Pre-oxygenation with 100% oxygen Induction Type: IV induction Ventilation: Mask ventilation without difficulty Laryngoscope Size: Mac and 4 Grade View: Grade I Tube type: Oral Tube size: 7.0 mm Number of attempts: 1 Airway Equipment and Method: Stylet and Oral airway Placement Confirmation: ETT inserted through vocal cords under direct vision, positive ETCO2 and breath sounds checked- equal and bilateral Secured at: 22 cm Tube secured with: Tape Dental Injury: Teeth and Oropharynx as per pre-operative assessment

## 2023-01-19 LAB — COMPREHENSIVE METABOLIC PANEL
ALT: 407 U/L — ABNORMAL HIGH (ref 0–44)
AST: 111 U/L — ABNORMAL HIGH (ref 15–41)
Albumin: 2.9 g/dL — ABNORMAL LOW (ref 3.5–5.0)
Alkaline Phosphatase: 206 U/L — ABNORMAL HIGH (ref 38–126)
Anion gap: 9 (ref 5–15)
BUN: 9 mg/dL (ref 6–20)
CO2: 27 mmol/L (ref 22–32)
Calcium: 8.7 mg/dL — ABNORMAL LOW (ref 8.9–10.3)
Chloride: 104 mmol/L (ref 98–111)
Creatinine, Ser: 0.79 mg/dL (ref 0.44–1.00)
GFR, Estimated: >60 mL/min (ref >60)
Glucose, Bld: 167 mg/dL — ABNORMAL HIGH (ref 70–99)
Potassium: 4.7 mmol/L (ref 3.5–5.1)
Sodium: 140 mmol/L (ref 135–145)
Total Bilirubin: 1.4 mg/dL — ABNORMAL HIGH (ref 0.3–1.2)
Total Protein: 6.6 g/dL (ref 6.5–8.1)

## 2023-01-19 NOTE — Progress Notes (Signed)
1 Day Post-Op   Subjective/Chief Complaint: Patient without complaint.  Denies significant abdominal pain   Objective: Vital signs in last 24 hours: Temp:  [97.8 F (36.6 C)-98.5 F (36.9 C)] 98.4 F (36.9 C) (10/05 0743) Pulse Rate:  [50-73] 51 (10/05 0743) Resp:  [13-21] 18 (10/05 0743) BP: (110-123)/(65-78) 123/78 (10/05 0743) SpO2:  [94 %-100 %] 100 % (10/05 0743) Weight:  [92.5 kg] 92.5 kg (10/04 1356) Last BM Date : 01/17/23  Intake/Output from previous day: 10/04 0701 - 10/05 0700 In: 1831.5 [I.V.:1626.5; IV Piggyback:205] Out: -  Intake/Output this shift: No intake/output data recorded.  General appearance: alert and cooperative Resp: clear to auscultation bilaterally Cardio: Normal sinus rhythm Incision/Wound: Port sites clean dry intact.  Soft nontender.  Lab Results:  No results for input(s): "WBC", "HGB", "HCT", "PLT" in the last 72 hours. BMET Recent Labs    01/17/23 0952 01/18/23 0401  NA 139 137  K 4.9 3.6  CL 102 102  CO2 29 28  GLUCOSE 103* 122*  BUN 6 <5*  CREATININE 0.86 0.73  CALCIUM 8.6* 7.9*   PT/INR No results for input(s): "LABPROT", "INR" in the last 72 hours. ABG No results for input(s): "PHART", "HCO3" in the last 72 hours.  Invalid input(s): "PCO2", "PO2"  Studies/Results: DG ERCP  Result Date: 01/19/2023 CLINICAL DATA:  Choledocholithiasis EXAM: ERCP TECHNIQUE: Multiple spot images obtained with the fluoroscopic device and submitted for interpretation post-procedure. FLUOROSCOPY: Radiation Exposure Index (as provided by the fluoroscopic device): 69.48 mGy Kerma COMPARISON:  MRCP 01/15/2023 FINDINGS: Thirteen saved intraoperative images are submitted for review. The images demonstrate flexible duodenal scope in the descending duodenum with wire cannulation of the common bile duct. Initial cholangiography demonstrates distal choledocholithiasis. Subsequent images confirm sphincterotomy and balloon sweeping of the common duct.  IMPRESSION: 1. Choledocholithiasis. 2. Sphincterotomy with balloon sweeping of the common duct. These images were submitted for radiologic interpretation only. Please see the procedural report for the amount of contrast and the fluoroscopy time utilized. Electronically Signed   By: Malachy Moan M.D.   On: 01/19/2023 07:04   DG C-Arm 1-60 Min-No Report  Result Date: 01/18/2023 Fluoroscopy was utilized by the requesting physician.  No radiographic interpretation.    Anti-infectives: Anti-infectives (From admission, onward)    Start     Dose/Rate Route Frequency Ordered Stop   01/14/23 2200  piperacillin-tazobactam (ZOSYN) IVPB 3.375 g        3.375 g 12.5 mL/hr over 240 Minutes Intravenous Every 8 hours 01/14/23 1613 01/21/23 2159   01/14/23 1545  piperacillin-tazobactam (ZOSYN) IVPB 3.375 g        3.375 g 100 mL/hr over 30 Minutes Intravenous  Once 01/14/23 1533 01/14/23 1745       Assessment/Plan: s/p Procedure(s) with comments: ENDOSCOPIC RETROGRADE CHOLANGIOPANCREATOGRAPHY (ERCP) WITH PROPOFOL (N/A) SPHINCTEROTOMY REMOVAL OF STONES HEMOSTASIS CONTROL - balloon tamponade POD 4 s/p laparoscopic cholecystectomy by Dr. Cliffton Asters for Acute Cholecystitis on 01/15/23  ERCP yesterday -doing well  Check LFTs today  Advance diet  Plan for discharge after lunch today if labs stable    - Mobilize - Pulm toilet   FEN - NPO for ERCP. Okay for diet post ERCP  VTE - SCDs, Lovenox ID - Zosyn >>  Afebrile. No tachycardia or systolic hypotension.   LOS: 3 days    Dortha Schwalbe MD 01/19/2023

## 2023-01-19 NOTE — Plan of Care (Signed)

## 2023-01-19 NOTE — Discharge Summary (Signed)
Physician Discharge Summary  Patient ID: Betty Sellers MRN: [id number] DOB/AGE: [birth year] 47 y.o.  Admit date: 01/14/2023 Discharge date: 01/19/2023  Admission Diagnoses: Acute cholecystitis and choledocholithiasis  Discharge Diagnoses:  Principal Problem:   Acute cholecystitis Active Problems:   Choledocholithiasis   Discharged Condition: good  Hospital Course: Patient admitted on 01/14/2023 with acute cholecystitis. Patient went to the OR on 01/15/2023 for laparoscopic cholecystectomy with cholangiogram.  She was found to have common duct stones and GI was consulted.  She underwent ERCP on 01/18/2023.  She she was doing well. Consults: GI  Significant Diagnostic Studies: labs:     Latest Ref Rng & Units 01/18/2023    4:01 AM 01/17/2023    9:52 AM 01/16/2023    5:49 AM  CMP  Glucose 70 - 99 mg/dL 403  474  259   BUN 6 - 20 mg/dL 5  6  5    Creatinine 0.44 - 1.00 mg/dL 5.63  8.75  6.43   Sodium 135 - 145 mmol/L 137  139  136   Potassium 3.5 - 5.1 mmol/L 3.6  4.9  4.2   Chloride 98 - 111 mmol/L 102  102  103   CO2 22 - 32 mmol/L 28  29  25    Calcium 8.9 - 10.3 mg/dL 7.9  8.6  8.4   Total Protein 6.5 - 8.1 g/dL 5.9  6.8  6.3   Total Bilirubin 0.3 - 1.2 mg/dL 2.2  2.3  2.4   Alkaline Phos 38 - 126 U/L 172  195  171   AST 15 - 41 U/L 219  251  306   ALT 0 - 44 U/L 466  573  570      Treatments: surgery: Laparoscopic cholecystectomy with intraoperative cholangiogram and ERCP  Discharge Exam: Blood pressure 123/78, pulse (!) 51, temperature 98.4 F (36.9 C), temperature source Oral, resp. rate 18, height 5\' 4"  (1.626 m), weight 92.5 kg, last menstrual period 01/10/2023, SpO2 100%. General appearance: alert Resp: clear to auscultation bilaterally Cardio: Normal sinus rhythm Incision/Wound: Port sites clean dry intact  Disposition:      Follow-up Information     Maczis, Puja Gosai, PA-C. Call.   Specialty: General Surgery Why: We are working on your appointment,call  to confirm, Arrive early to check in, fill out paperwork, Designer, fashion/clothing ID and insurance information Contact information: [address] [place] [address] [place] [address] [phone]                 Signed: Dortha Schwalbe 01/19/2023, 9:50 AM

## 2023-01-20 MED ORDER — OXYCODONE HCL 5 MG PO TABS: 5.0000 mg | ORAL_TABLET | Freq: Four times a day (QID) | ORAL | 0 refills | Status: DC | PRN

## 2023-01-20 NOTE — Progress Notes (Signed)
RN went over DC instructions with the pt and she stated understanding IV has been removed and she is getting dressed. Prescriptions has been escribed to home pharmacy.

## 2023-01-20 NOTE — Discharge Summary (Signed)
Pt labs improved  See previous summary

## 2023-01-21 ENCOUNTER — Encounter (HOSPITAL_COMMUNITY): Payer: Self-pay | Admitting: Internal Medicine

## 2023-01-21 LAB — SURGICAL PATHOLOGY

## 2023-11-20 ENCOUNTER — Other Ambulatory Visit

## 2023-11-20 DIAGNOSIS — Z1321 Encounter for screening for nutritional disorder: Secondary | ICD-10-CM | POA: Diagnosis not present

## 2023-11-20 DIAGNOSIS — Z6841 Body Mass Index (BMI) 40.0 and over, adult: Secondary | ICD-10-CM | POA: Diagnosis not present

## 2023-11-21 ENCOUNTER — Ambulatory Visit: Payer: Self-pay

## 2023-11-21 LAB — CBC WITH DIFFERENTIAL/PLATELET
Basophils Absolute: 0 x10E3/uL (ref 0.0–0.2)
Basos: 0 %
EOS (ABSOLUTE): 0.1 x10E3/uL (ref 0.0–0.4)
Eos: 1 %
Hematocrit: 36.5 % (ref 34.0–46.6)
Hemoglobin: 11.6 g/dL (ref 11.1–15.9)
Immature Grans (Abs): 0 x10E3/uL (ref 0.0–0.1)
Immature Granulocytes: 0 %
Lymphocytes Absolute: 1.6 x10E3/uL (ref 0.7–3.1)
Lymphs: 23 %
MCH: 28.9 pg (ref 26.6–33.0)
MCHC: 31.8 g/dL (ref 31.5–35.7)
MCV: 91 fL (ref 79–97)
Monocytes Absolute: 0.5 x10E3/uL (ref 0.1–0.9)
Monocytes: 7 %
Neutrophils Absolute: 4.8 x10E3/uL (ref 1.4–7.0)
Neutrophils: 69 %
Platelets: 360 x10E3/uL (ref 150–450)
RBC: 4.01 x10E6/uL (ref 3.77–5.28)
RDW: 13.6 % (ref 11.7–15.4)
WBC: 7.1 x10E3/uL (ref 3.4–10.8)

## 2023-11-21 LAB — LIPID PANEL
Chol/HDL Ratio: 3.2 ratio (ref 0.0–4.4)
Cholesterol, Total: 164 mg/dL (ref 100–199)
HDL: 52 mg/dL (ref >39)
LDL Chol Calc (NIH): 99 mg/dL (ref 0–99)
Triglycerides: 67 mg/dL (ref 0–149)
VLDL Cholesterol Cal: 13 mg/dL (ref 5–40)

## 2023-11-21 LAB — COMPREHENSIVE METABOLIC PANEL WITH GFR
ALT: 11 IU/L (ref 0–32)
AST: 16 IU/L (ref 0–40)
Albumin: 4.1 g/dL (ref 3.9–4.9)
Alkaline Phosphatase: 49 IU/L (ref 44–121)
BUN/Creatinine Ratio: 15 (ref 9–23)
BUN: 11 mg/dL (ref 6–24)
Bilirubin Total: 0.3 mg/dL (ref 0.0–1.2)
CO2: 21 mmol/L (ref 20–29)
Calcium: 8.7 mg/dL (ref 8.7–10.2)
Chloride: 106 mmol/L (ref 96–106)
Creatinine, Ser: 0.75 mg/dL (ref 0.57–1.00)
Globulin, Total: 2.6 g/dL (ref 1.5–4.5)
Glucose: 100 mg/dL — ABNORMAL HIGH (ref 70–99)
Potassium: 5.2 mmol/L (ref 3.5–5.2)
Sodium: 140 mmol/L (ref 134–144)
Total Protein: 6.7 g/dL (ref 6.0–8.5)
eGFR: 100 mL/min/1.73 (ref >59)

## 2023-11-21 LAB — HEMOGLOBIN A1C
Est. average glucose Bld gHb Est-mCnc: 114 mg/dL
Hgb A1c MFr Bld: 5.6 % (ref 4.8–5.6)

## 2023-11-21 LAB — TSH: TSH: 2.07 u[IU]/mL (ref 0.450–4.500)

## 2023-11-21 LAB — VITAMIN D 25 HYDROXY (VIT D DEFICIENCY, FRACTURES): Vit D, 25-Hydroxy: 28.4 ng/mL — ABNORMAL LOW (ref 30.0–100.0)

## 2023-11-26 ENCOUNTER — Ambulatory Visit (INDEPENDENT_AMBULATORY_CARE_PROVIDER_SITE_OTHER)

## 2023-11-26 VITALS — BP 113/75 | HR 56 | Ht 64.0 in | Wt 238.1 lb

## 2023-11-26 DIAGNOSIS — Z1211 Encounter for screening for malignant neoplasm of colon: Secondary | ICD-10-CM | POA: Diagnosis not present

## 2023-11-26 DIAGNOSIS — M25461 Effusion, right knee: Secondary | ICD-10-CM | POA: Insufficient documentation

## 2023-11-26 DIAGNOSIS — Z6841 Body Mass Index (BMI) 40.0 and over, adult: Secondary | ICD-10-CM

## 2023-11-26 DIAGNOSIS — K5900 Constipation, unspecified: Secondary | ICD-10-CM | POA: Insufficient documentation

## 2023-11-26 DIAGNOSIS — Z1231 Encounter for screening mammogram for malignant neoplasm of breast: Secondary | ICD-10-CM | POA: Diagnosis not present

## 2023-11-26 DIAGNOSIS — Z0000 Encounter for general adult medical examination without abnormal findings: Secondary | ICD-10-CM | POA: Insufficient documentation

## 2023-11-26 DIAGNOSIS — E559 Vitamin D deficiency, unspecified: Secondary | ICD-10-CM | POA: Insufficient documentation

## 2023-11-26 NOTE — Patient Instructions (Signed)
 VISIT SUMMARY: Today, you had your annual physical exam and reviewed your lab results. Your blood sugar was slightly elevated, and your vitamin D  level was low. We also discussed your gastrointestinal symptoms, knee injury, and leg swelling.  YOUR PLAN: ADULT WELLNESS VISIT: Routine wellness visit with normal labs except for slightly elevated blood sugar and vitamin D  insufficiency. -Order colonoscopy for comprehensive colon cancer screening. -Order mammogram. -Recommend over-the-counter vitamin D3 2000 IU daily. -Advise on flu vaccination availability in September. -Schedule next physical in one year.  CONSTIPATION: Chronic constipation managed with natural remedies. -Continue current tea regimen for constipation. -Recommend Miralax  as needed for constipation.  VITAMIN D  INSUFFICIENCY: Vitamin D  levels insufficient but not requiring prescription treatment. -Recommend over-the-counter vitamin D3 2000 IU daily.  -Previous weight loss improved cholesterol levels. -Encourage weight loss efforts. -Monitor cholesterol and weight annually.  EDEMA OF LOWER EXTREMITIES: Mild edema possibly related to high sodium diet or inadequate water intake. -Monitor for changes in edema with dietary modifications. -Consider diuretics if edema worsens.  KNEE EFFUSION, RESOLVING: Knee effusion post-fall resolving without pain or functional limitations. -Monitor knee for changes in swelling or pain. -Consider x-ray if symptoms worsen.  If you have any problems before your next visit feel free to message me via MyChart (minor issues or questions) or call the office, otherwise you may reach out to schedule an office visit.  Thank you! Saddie Sacks, PA-C

## 2023-11-26 NOTE — Assessment & Plan Note (Signed)
 Knee effusion post-fall resolving without pain or functional limitations. - Monitor knee for changes in swelling or pain. - Consider x-ray if symptoms worsen.

## 2023-11-26 NOTE — Assessment & Plan Note (Signed)
 Chronic constipation managed with natural remedies. Miralax  discussed as a safe daily option if needed. - Continue current tea regimen for constipation. - Recommend Miralax  as needed for constipation.

## 2023-11-26 NOTE — Progress Notes (Signed)
 Complete physical exam  Patient: Betty Sellers   DOB: [birth year]   48 y.o. Female  MRN: [id number]  Subjective:    Chief Complaint  Patient presents with   Annual Exam    Physical   History of Present Illness Betty Sellers is a 48 year old female who presents for an annual physical exam and review of lab results. She reports she feels well and is sleeping well. Reports that she is starting a diet today in an effort to lose weight. Was previously doing Optavia but stopped after a bout of cholecystitis s/p cholecystectomy earlier this year. 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   Gastrointestinal symptoms - History of cholecystectomy following gallbladder attacks, including one episode after weight loss on 'the uptake' diet plan - Constipation requiring use of natural teas and occasional Miralax  for relief - Bowel movements less frequent than desired, typically requiring assistance from laxatives  Musculoskeletal injury - Sustained a fall from a loft in her new house resulting in a knee injury - Presence of a small bump on the knee - No pain or limitation in mobility associated with the knee injury     Most recent fall risk assessment:    11/26/2023    1:13 PM  Fall Risk   Falls in the past year? 1  Number falls in past yr: 0  Injury with Fall? 0  Risk for fall due to : Other (Comment)  Follow up Falls evaluation completed     Most recent depression screenings:    11/26/2023    1:14 PM 05/29/2021    3:50 PM  PHQ 2/9 Scores  PHQ - 2 Score 0 0  PHQ- 9 Score  0    Vision:Within last year and Dental: No current dental problems and Receives regular dental care    Patient Care Team: Gayle Saddie JULIANNA DEVONNA as PCP - General (Physician Assistant)   Outpatient Medications Prior to Visit  Medication Sig   oxyCODONE  (OXY IR/ROXICODONE ) 5 MG immediate release tablet Take 1 tablet (5 mg total) by mouth every 6 (six) hours as needed for severe pain.   No facility-administered  medications prior to visit.    ROS   Per HPI     Objective:     BP 113/75   Pulse (!) 56   Ht 5' 4 (1.626 m)   Wt 238 lb 1.3 oz (108 kg)   LMP 11/22/2023   SpO2 100%   BMI 40.87 kg/m    Physical Exam Constitutional:      General: She is not in acute distress.    Appearance: Normal appearance.  HENT:     Right Ear: Tympanic membrane normal.     Left Ear: Tympanic membrane normal.     Mouth/Throat:     Mouth: Mucous membranes are moist.     Pharynx: Oropharynx is clear.  Eyes:     Pupils: Pupils are equal, round, and reactive to light.  Cardiovascular:     Rate and Rhythm: Normal rate and regular rhythm.     Heart sounds: Normal heart sounds. No murmur heard.    No friction rub. No gallop.  Pulmonary:     Effort: Pulmonary effort is normal. No respiratory distress.     Breath sounds: Normal breath sounds.  Abdominal:     General: Abdomen is flat. Bowel sounds are normal.     Palpations: Abdomen is soft.  Musculoskeletal:        General: No swelling.     Cervical  back: Normal range of motion.     Comments: Small effusion palpated on right knee. No bruising or abrasion.  Lymphadenopathy:     Cervical: No cervical adenopathy.  Skin:    General: Skin is warm and dry.  Neurological:     General: No focal deficit present.     Mental Status: She is alert.  Psychiatric:        Mood and Affect: Mood normal.        Behavior: Behavior normal.        Thought Content: Thought content normal.      No results found for any visits on 11/26/23. Last CBC Lab Results  Component Value Date   WBC 7.1 11/20/2023   HGB 11.6 11/20/2023   HCT 36.5 11/20/2023   MCV 91 11/20/2023   MCH 28.9 11/20/2023   RDW 13.6 11/20/2023   PLT 360 11/20/2023   Last metabolic panel Lab Results  Component Value Date   GLUCOSE 100 (H) 11/20/2023   NA 140 11/20/2023   K 5.2 11/20/2023   CL 106 11/20/2023   CO2 21 11/20/2023   BUN 11 11/20/2023   CREATININE 0.75 11/20/2023   EGFR  100 11/20/2023   CALCIUM 8.7 11/20/2023   PROT 6.7 11/20/2023   ALBUMIN 4.1 11/20/2023   LABGLOB 2.6 11/20/2023   AGRATIO 1.5 03/23/2021   BILITOT 0.3 11/20/2023   ALKPHOS 49 11/20/2023   AST 16 11/20/2023   ALT 11 11/20/2023   ANIONGAP 9 01/19/2023   Last lipids Lab Results  Component Value Date   CHOL 164 11/20/2023   HDL 52 11/20/2023   LDLCALC 99 11/20/2023   TRIG 67 11/20/2023   CHOLHDL 3.2 11/20/2023   Last hemoglobin A1c Lab Results  Component Value Date   HGBA1C 5.6 11/20/2023   Last thyroid  functions Lab Results  Component Value Date   TSH 2.070 11/20/2023   Last vitamin D  Lab Results  Component Value Date   VD25OH 28.4 (L) 11/20/2023        Assessment & Plan:    Routine Health Maintenance and Physical Exam  Health Maintenance  Topic Date Due   Hepatitis B Vaccine (1 of 3 - 19+ 3-dose series) Never done   HPV Vaccine (1 - 3-dose SCDM series) Never done   COVID-19 Vaccine (3 - 2024-25 season) 12/16/2022   Colon Cancer Screening  Never done   Flu Shot  11/15/2023   Pap with HPV screening  03/31/2026   DTaP/Tdap/Td vaccine (2 - Td or Tdap) 04/01/2031   Hepatitis C Screening  Completed   HIV Screening  Completed   Meningitis B Vaccine  Aged Out    Discussed health benefits of physical activity, and encouraged her to engage in regular exercise appropriate for her age and condition.  Screening mammogram for breast cancer -     3D Screening Mammogram, Left and Right; Future  Screen for colon cancer -     Ambulatory referral to Gastroenterology  General medical exam Assessment & Plan: Routine wellness visit with normal labs except for slightly elevated blood sugar and vitamin D  insufficiency. Colonoscopy preferred for comprehensive colon cancer screening. - Order colonoscopy. - Order mammogram. - Recommend over-the-counter vitamin D3 2000 IU daily. - Advise on flu vaccination availability in September. - Discuss COVID vaccination options. -  Schedule next physical in one year.   Constipation, unspecified constipation type Assessment & Plan: Chronic constipation managed with natural remedies. Miralax  discussed as a safe daily option if needed. - Continue current  tea regimen for constipation. - Recommend Miralax  as needed for constipation.   Vitamin D  insufficiency Assessment & Plan: Vitamin D  levels insufficient but not requiring prescription treatment. Discussed potential symptoms related to low vitamin D . - Recommend over-the-counter vitamin D3 2000 IU daily.   Body mass index (BMI) of 40.1-44.9 in adult [facility]) Assessment & Plan: Weight fluctuations due to diet and family situations. Previous weight loss improved cholesterol levels. Plans to restart weight loss efforts. - Encourage weight loss efforts. - Monitor cholesterol and weight annually.   Knee effusion, right Assessment & Plan: Knee effusion post-fall resolving without pain or functional limitations. - Monitor knee for changes in swelling or pain. - Consider x-ray if symptoms worsen.     Return in about 1 year (around 11/25/2024) for Physical.     Saddie JULIANNA Sacks, PA-C

## 2023-11-26 NOTE — Assessment & Plan Note (Signed)
 Routine wellness visit with normal labs except for slightly elevated blood sugar and vitamin D  insufficiency. Colonoscopy preferred for comprehensive colon cancer screening. - Order colonoscopy. - Order mammogram. - Recommend over-the-counter vitamin D3 2000 IU daily. - Advise on flu vaccination availability in September. - Discuss COVID vaccination options. - Schedule next physical in one year.

## 2023-11-26 NOTE — Assessment & Plan Note (Signed)
 Vitamin D  levels insufficient but not requiring prescription treatment. Discussed potential symptoms related to low vitamin D . - Recommend over-the-counter vitamin D3 2000 IU daily.

## 2023-11-26 NOTE — Assessment & Plan Note (Signed)
 Weight fluctuations due to diet and family situations. Previous weight loss improved cholesterol levels. Plans to restart weight loss efforts. - Encourage weight loss efforts. - Monitor cholesterol and weight annually.

## 2023-12-01 ENCOUNTER — Other Ambulatory Visit: Payer: Self-pay | Admitting: Medical Genetics

## 2023-12-07 ENCOUNTER — Other Ambulatory Visit
Admission: RE | Admit: 2023-12-07 | Discharge: 2023-12-07 | Disposition: A | Discharge status: Home / Self Care | Payer: Self-pay | Source: Ambulatory Visit | Attending: Medical Genetics | Admitting: Medical Genetics

## 2023-12-10 ENCOUNTER — Ambulatory Visit
Admission: RE | Admit: 2023-12-10 | Discharge: 2023-12-10 | Disposition: A | Discharge status: Home / Self Care | Source: Ambulatory Visit

## 2023-12-10 DIAGNOSIS — Z1231 Encounter for screening mammogram for malignant neoplasm of breast: Secondary | ICD-10-CM

## 2023-12-22 LAB — GENECONNECT MOLECULAR SCREEN: Genetic Analysis Overall Interpretation: NEGATIVE

## 2023-12-30 ENCOUNTER — Encounter: Payer: Self-pay | Admitting: Internal Medicine

## 2024-02-04 ENCOUNTER — Ambulatory Visit (AMBULATORY_SURGERY_CENTER)

## 2024-02-04 VITALS — Ht 63.0 in | Wt 235.0 lb

## 2024-02-04 DIAGNOSIS — Z1211 Encounter for screening for malignant neoplasm of colon: Secondary | ICD-10-CM

## 2024-02-04 MED ORDER — NA SULFATE-K SULFATE-MG SULF 17.5-3.13-1.6 GM/177ML PO SOLN
1.0000 | Freq: Once | ORAL | 0 refills | Status: AC
Start: 2024-02-04 — End: 2024-02-04

## 2024-02-04 NOTE — Progress Notes (Signed)
 No egg or soy allergy known to patient  No issues known to pt with past sedation with any surgeries or procedures Patient denies ever being told they had issues or difficulty with intubation  No FH of Malignant Hyperthermia Pt is not on diet pills Pt is not on  home 02  Pt is not on blood thinners  Pt denies issues with constipation  No A fib or A flutter Have any cardiac testing Walks independently Pt denies use of chewing tobacco Discussed diabetic I weight loss medication holds Discussed NSAID holds Checked BMI Pt instructed to use Single[employer] or GoodRx for a price reduction on prep  Patient's chart reviewed by Norleen Schillings CNRA prior to previsit and patient appropriate for the [facility].  Pre visit completed and red dot placed by patient's name on their procedure day (on provider's schedule).

## 2024-02-12 ENCOUNTER — Encounter: Payer: Self-pay | Admitting: Internal Medicine

## 2024-02-13 ENCOUNTER — Encounter: Payer: Self-pay | Admitting: Internal Medicine

## 2024-02-18 ENCOUNTER — Ambulatory Visit (AMBULATORY_SURGERY_CENTER): Admitting: Internal Medicine

## 2024-02-18 ENCOUNTER — Encounter: Payer: Self-pay | Admitting: Internal Medicine

## 2024-02-18 VITALS — BP 142/72 | HR 71 | Temp 98.0°F | Resp 16 | Ht 64.0 in | Wt 235.0 lb

## 2024-02-18 DIAGNOSIS — Z1211 Encounter for screening for malignant neoplasm of colon: Secondary | ICD-10-CM | POA: Diagnosis not present

## 2024-02-18 MED ORDER — SODIUM CHLORIDE 0.9 % IV SOLN: 500.0000 mL | Freq: Once | INTRAVENOUS | Status: DC

## 2024-02-18 NOTE — Progress Notes (Signed)
 A/o x 3, VSS, good SR's, pleased with anesthesia, report to RN

## 2024-02-18 NOTE — Patient Instructions (Signed)
-  repeat colonoscopy in 10 years  for surveillance recommended.  -Continue present medications    YOU HAD AN ENDOSCOPIC PROCEDURE TODAY AT THE [facility]:   Refer to the procedure report that was given to you for any specific questions about what was found during the examination.  If the procedure report does not answer your questions, please call your gastroenterologist to clarify.  If you requested that your care partner not be given the details of your procedure findings, then the procedure report has been included in a sealed envelope for you to review at your convenience later.  YOU SHOULD EXPECT: Some feelings of bloating in the abdomen. Passage of more gas than usual.  Walking can help get rid of the air that was put into your GI tract during the procedure and reduce the bloating. If you had a lower endoscopy (such as a colonoscopy or flexible sigmoidoscopy) you may notice spotting of blood in your stool or on the toilet paper. If you underwent a bowel prep for your procedure, you may not have a normal bowel movement for a few days.  Please Note:  You might notice some irritation and congestion in your nose or some drainage.  This is from the oxygen used during your procedure.  There is no need for concern and it should clear up in a day or so.  SYMPTOMS TO REPORT IMMEDIATELY:  Following lower endoscopy (colonoscopy or flexible sigmoidoscopy):  Excessive amounts of blood in the stool  Significant tenderness or worsening of abdominal pains  Swelling of the abdomen that is new, acute  Fever of 100F or higher  For urgent or emergent issues, a gastroenterologist can be reached at any hour by calling [phone]. Do not use MyChart messaging for urgent concerns.    DIET:  We do recommend a small meal at first, but then you may proceed to your regular diet.  Drink plenty of fluids but you should avoid alcoholic beverages for 24 hours.  ACTIVITY:  You should plan to take it  easy for the rest of today and you should NOT DRIVE or use heavy machinery until tomorrow (because of the sedation medicines used during the test).    FOLLOW UP: Our staff will call the number listed on your records the next business day following your procedure.  We will call around 7:15- 8:00 am to check on you and address any questions or concerns that you may have regarding the information given to you following your procedure. If we do not reach you, we will leave a message.     If any biopsies were taken you will be contacted by phone or by letter within the next 1-3 weeks.  Please call us at [phone] if you have not heard about the biopsies in 3 weeks.    SIGNATURES/CONFIDENTIALITY: You and/or your care partner have signed paperwork which will be entered into your electronic medical record.  These signatures attest to the fact that that the information above on your After Visit Summary has been reviewed and is understood.  Full responsibility of the confidentiality of this discharge information lies with you and/or your care-partner.

## 2024-02-18 NOTE — Progress Notes (Signed)
 HISTORY OF PRESENT ILLNESS:  Betty Sellers is a 48 y.o. female sent directly for screening colonoscopy.  No complaints  REVIEW OF SYSTEMS:  All non-GI ROS negative except for  History reviewed. No pertinent past medical history.  Past Surgical History:  Procedure Laterality Date   CESAREAN SECTION  2010   CESAREAN SECTION  2012   CHOLECYSTECTOMY N/A 01/15/2023   Procedure: LAPAROSCOPIC CHOLECYSTECTOMY WITH ICG;  Surgeon: Betty Lonni HERO, MD;  Location: [facility] OR;  Service: General;  Laterality: N/A;   ENDOSCOPIC RETROGRADE CHOLANGIOPANCREATOGRAPHY (ERCP) WITH PROPOFOL  N/A 01/18/2023   Procedure: ENDOSCOPIC RETROGRADE CHOLANGIOPANCREATOGRAPHY (ERCP) WITH PROPOFOL ;  Surgeon: Betty Norleen SAILOR, MD;  Location: [facility] ENDOSCOPY;  Service: Gastroenterology;  Laterality: N/A;   HEMOSTASIS CONTROL  01/18/2023   Procedure: HEMOSTASIS CONTROL;  Surgeon: Betty Norleen SAILOR, MD;  Location: [facility] ENDOSCOPY;  Service: Gastroenterology;;  balloon tamponade   REMOVAL OF STONES  01/18/2023   Procedure: REMOVAL OF STONES;  Surgeon: Betty Norleen SAILOR, MD;  Location: [facility] ENDOSCOPY;  Service: Gastroenterology;;   Betty Sellers  01/18/2023   Procedure: SPHINCTEROTOMY;  Surgeon: Betty Norleen SAILOR, MD;  Location: [facility] ENDOSCOPY;  Service: Gastroenterology;;    Social History Betty Sellers  reports that she quit smoking about 16 years ago. Her smoking use included cigarettes. She has never used smokeless tobacco. She reports current alcohol use. She reports that she does not use drugs.  family history includes Cancer in her father; Diabetes in an other family member; Heart failure in her father.  No Known Allergies     PHYSICAL EXAMINATION: Vital signs: BP 130/81   Pulse (!) 54   Temp 98 F (36.7 C) (Temporal)   Ht 5' 4 (1.626 m)   Wt 235 lb (106.6 kg)   LMP 02/10/2024   SpO2 100%   BMI 40.34 kg/m  General: Well-developed, well-nourished, no acute distress HEENT: Sclerae are anicteric, conjunctiva pink. Oral mucosa  intact Lungs: Clear Heart: Regular Abdomen: soft, nontender, nondistended, no obvious ascites, no peritoneal signs, normal bowel sounds. No organomegaly. Extremities: No edema Psychiatric: alert and oriented x3. Cooperative     ASSESSMENT:  Colon cancer screening   PLAN:  Screening colonoscopy

## 2024-02-18 NOTE — Op Note (Signed)
 [facility] Patient Name: Betty Sellers Procedure Date: 02/18/2024 10:07 AM MRN: [id number] Endoscopist: Norleen SAILOR. Abran , MD, [id number] Age: 48 Referring MD:  Date of Birth: [birth year] Gender: Female Account #: [id number] Procedure:                Colonoscopy Indications:              Screening for colorectal malignant neoplasm Medicines:                Monitored Anesthesia Care Procedure:                Pre-Anesthesia Assessment:                           - Prior to the procedure, a History and Physical                            was performed, and patient medications and                            allergies were reviewed. The patient's tolerance of                            previous anesthesia was also reviewed. The risks                            and benefits of the procedure and the sedation                            options and risks were discussed with the patient.                            All questions were answered, and informed consent                            was obtained. Prior Anticoagulants: The patient has                            taken no anticoagulant or antiplatelet agents. ASA                            Grade Assessment: II - A patient with mild systemic                            disease. After reviewing the risks and benefits,                            the patient was deemed in satisfactory condition to                            undergo the procedure.                           After obtaining informed consent, the colonoscope  was passed under direct vision. Throughout the                            procedure, the patient's blood pressure, pulse, and                            oxygen saturations were monitored continuously. The                            CF [id number] #[id number] was introduced through the anus                            and advanced to the the cecum, identified by                            appendiceal  orifice and ileocecal valve. The                            ileocecal valve, appendiceal orifice, and rectum                            were photographed. The quality of the bowel                            preparation was excellent. The colonoscopy was                            performed without difficulty. The patient tolerated                            the procedure well. The bowel preparation used was                            SUPREP via split dose instruction. Scope In: 10:20:06 AM Scope Out: 10:31:11 AM Scope Withdrawal Time: 0 hours 8 minutes 59 seconds  Total Procedure Duration: 0 hours 11 minutes 5 seconds  Findings:                 The entire examined colon appeared normal on direct                            and retroflexion views. Complications:            No immediate complications. Estimated blood loss:                            None. Estimated Blood Loss:     Estimated blood loss: none. Impression:               - The entire examined colon is normal on direct and                            retroflexion views.                           - No  specimens collected. Recommendation:           - Repeat colonoscopy in 10 years for screening                            purposes.                           - Patient has a contact number available for                            emergencies. The signs and symptoms of potential                            delayed complications were discussed with the                            patient. Return to normal activities tomorrow.                            Written discharge instructions were provided to the                            patient.                           - Resume previous diet.                           - Continue present medications. Norleen SAILOR. Abran, MD 02/18/2024 10:38:43 AM This report has been signed electronically.

## 2024-02-18 NOTE — Progress Notes (Signed)
 Pt's states no medical or surgical changes since previsit or office visit.

## 2024-02-19 ENCOUNTER — Telehealth: Payer: Self-pay | Admitting: *Deleted

## 2024-02-19 NOTE — Telephone Encounter (Signed)
  Follow up Call-     02/18/2024    8:57 AM  Call back number  Post procedure Call Back phone  # [phone]  Permission to leave phone message Yes     Patient questions:  Do you have a fever, pain , or abdominal swelling? No. Pain Score  0 *  Have you tolerated food without any problems? Yes.    Have you been able to return to your normal activities? Yes.    Do you have any questions about your discharge instructions: Diet   No. Medications  No. Follow up visit  No.  Do you have questions or concerns about your Care? No.  Actions: * If pain score is 4 or above: No action needed, pain <4.

## 2024-11-19 ENCOUNTER — Other Ambulatory Visit

## 2024-11-26 ENCOUNTER — Encounter
